# Patient Record
Sex: Female | Born: 1980 | Race: Black or African American | Hispanic: No | Marital: Single | Smoking: Never smoker
Health system: Southern US, Community
[De-identification: ages and names within clinical notes are randomized; demographics above are authoritative.]

## PROBLEM LIST (undated history)

## (undated) DIAGNOSIS — E119 Type 2 diabetes mellitus without complications: Secondary | ICD-10-CM

---
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
## 1999-08-22 ENCOUNTER — Other Ambulatory Visit: Admission: RE | Admit: 1999-08-22 | Discharge: 1999-08-22 | Payer: Self-pay | Admitting: Obstetrics and Gynecology

## 1999-08-31 ENCOUNTER — Ambulatory Visit (HOSPITAL_COMMUNITY): Admission: RE | Admit: 1999-08-31 | Discharge: 1999-08-31 | Payer: Self-pay | Admitting: Obstetrics and Gynecology

## 1999-09-04 ENCOUNTER — Other Ambulatory Visit: Admission: RE | Admit: 1999-09-04 | Discharge: 1999-09-04 | Payer: Self-pay | Admitting: Obstetrics and Gynecology

## 2001-06-17 ENCOUNTER — Emergency Department (HOSPITAL_COMMUNITY): Admission: EM | Admit: 2001-06-17 | Discharge: 2001-06-17 | Payer: Self-pay | Admitting: Emergency Medicine

## 2004-01-06 ENCOUNTER — Other Ambulatory Visit: Admission: RE | Admit: 2004-01-06 | Discharge: 2004-01-06 | Payer: Self-pay | Admitting: Obstetrics and Gynecology

## 2004-07-27 ENCOUNTER — Emergency Department (HOSPITAL_COMMUNITY): Admission: EM | Admit: 2004-07-27 | Discharge: 2004-07-27 | Payer: Self-pay | Admitting: *Deleted

## 2004-09-12 ENCOUNTER — Other Ambulatory Visit: Admission: RE | Admit: 2004-09-12 | Discharge: 2004-09-12 | Payer: Self-pay | Admitting: Obstetrics and Gynecology

## 2004-10-04 ENCOUNTER — Other Ambulatory Visit: Admission: RE | Admit: 2004-10-04 | Discharge: 2004-10-04 | Payer: Self-pay | Admitting: Obstetrics and Gynecology

## 2014-08-23 ENCOUNTER — Ambulatory Visit (INDEPENDENT_AMBULATORY_CARE_PROVIDER_SITE_OTHER): Payer: Managed Care, Other (non HMO) | Admitting: Family Medicine

## 2014-08-23 VITALS — BP 108/70 | HR 127 | Temp 98.8°F | Resp 16 | Ht 68.0 in | Wt 213.0 lb

## 2014-08-23 DIAGNOSIS — J029 Acute pharyngitis, unspecified: Secondary | ICD-10-CM | POA: Diagnosis not present

## 2014-08-23 MED ORDER — AMOXICILLIN 875 MG PO TABS: 875.0000 mg | ORAL_TABLET | Freq: Two times a day (BID) | ORAL | Status: DC

## 2014-08-23 NOTE — Patient Instructions (Signed)
We are running a throat culture. This should be ready on Wednesday. I'll you start taking the antibiotics twice a day instead of work until Wednesday. You can take ibuprofen to control the muscle aches and possibly fever

## 2014-08-23 NOTE — Addendum Note (Signed)
Addended by: Johnnette LitterARDWELL, Vaughan Garfinkle M on: 08/23/2014 10:40 AM   Modules accepted: Kipp BroodSmartSet

## 2014-08-23 NOTE — Progress Notes (Signed)
Patient ID: Haley Cunningham, female   DOB: April 24, 1981, 34 y.o.   MRN: [id number]  This chart was scribed for Elvina Sidle, MD by Charline Bills, ED Scribe. The patient was seen in room 12. Patient's care was started at 10:12 AM.  Patient ID: Haley Cunningham MRN: [id number], DOB: [birth year], 34 y.o. Date of Encounter: 08/23/2014, 10:12 AM  Primary Physician: No primary care provider on file.  Chief Complaint  Patient presents with   Sore Throat    Onset 5 days   Headache   Fatigue   Cough   HPI: 34 y.o. year old female with history below presents with gradually worsening, persistent sore throat for the past 5 days. She noticed sore throat 5 days ago but states that it worsened with swallowing 4 days ago. Pt reports associated chills, diaphoresis, cough, fatigue, HA. She denies fever. No known allergies.   Pt works at the [employer].   History reviewed. No pertinent past medical history.   Home Meds: Prior to Admission medications   Medication Sig Start Date End Date Taking? Authorizing Provider  ferrous fumarate (HEMOCYTE - 106 MG FE) 325 (106 FE) MG TABS tablet Take 1 tablet by mouth.   Yes Historical Provider, MD  phentermine (ADIPEX-P) 37.5 MG tablet Take 37.5 mg by mouth daily before breakfast.   Yes Historical Provider, MD  Vitamin D, Ergocalciferol, (DRISDOL) 50000 UNITS CAPS capsule Take 50,000 Units by mouth every 7 (seven) days.   Yes Historical Provider, MD    Allergies: Not on File  History   Social History   Marital Status: Single    Spouse Name: N/A   Number of Children: N/A   Years of Education: N/A   Occupational History   Not on file.   Social History Main Topics   Smoking status: Never Smoker    Smokeless tobacco: Not on file   Alcohol Use: No   Drug Use: No   Sexual Activity: Not on file   Other Topics Concern   Not on file   Social History Narrative   No narrative on file     Review of Systems: Constitutional:  negative for fever, night sweats, weight changes, + fatigue, + chills, + diaphoresis  HEENT: negative for vision changes, hearing loss, congestion, rhinorrhea, ST, epistaxis, or sinus pressure Cardiovascular: negative for chest pain or palpitations Respiratory: negative for hemoptysis, wheezing or shortness of breath, + cough Abdominal: negative for abdominal pain, nausea, vomiting, diarrhea, or constipation Dermatological: negative for rash Neurologic: negative for dizziness, or syncope, + headache All other systems reviewed and are otherwise negative with the exception to those above and in the HPI.  Physical Exam: Blood pressure 108/70, pulse 127, temperature 98.8 F (37.1 C), temperature source Oral, resp. rate 16, height  (1.727 m), weight 213 lb (96.616 kg), last menstrual period 08/20/2014, SpO2 96 %., Body mass index is 32.39 kg/(m^2). General: Well developed, well nourished, in no acute distress. Head: Normocephalic, atraumatic, eyes without discharge, sclera non-icteric, nares are without discharge. Bilateral auditory canals clear, bilateral TMs are dull. Oral cavity moist, posterior pharynx without exudate, peritonsillar abscess, or post nasal drip. Posterior erythema. Neck: Supple. No thyromegaly. Full ROM. No lymphadenopathy. Lungs: Clear bilaterally to auscultation without wheezes, rales, or rhonchi. Breathing is unlabored. Heart: RRR with S1 S2. No murmurs, rubs, or gallops appreciated. Abdomen: Soft, non-tender, non-distended with normoactive bowel sounds. No hepatomegaly. No rebound/guarding. No obvious abdominal masses. Msk:  Strength and tone normal for age. Extremities/Skin: Warm and dry.  No clubbing or cyanosis. No edema. No rashes or suspicious lesions. Neuro: Alert and oriented X 3. Moves all extremities spontaneously. Gait is normal. CNII-XII grossly in tact. Psych:  Responds to questions appropriately with a normal affect.    ASSESSMENT AND PLAN:  34 y.o. year  old female with  1. Acute pharyngitis, unspecified pharyngitis type     This chart was scribed in my presence and reviewed by me personally.    ICD-9-CM ICD-10-CM   1. Acute pharyngitis, unspecified pharyngitis type 462 J02.9 Culture, Group A Strep     amoxicillin (AMOXIL) 875 MG tablet     Signed, Elvina SidleKurt Lauenstein, MD   Signed, Elvina SidleKurt Lauenstein, MD 08/23/2014 10:12 AM

## 2014-08-25 LAB — CULTURE, GROUP A STREP: Organism ID, Bacteria: NORMAL

## 2015-09-12 ENCOUNTER — Ambulatory Visit (INDEPENDENT_AMBULATORY_CARE_PROVIDER_SITE_OTHER): Payer: Managed Care, Other (non HMO) | Admitting: Physician Assistant

## 2015-09-12 VITALS — BP 122/82 | HR 98 | Temp 99.0°F | Resp 16 | Ht 69.0 in | Wt 213.0 lb

## 2015-09-12 DIAGNOSIS — L02419 Cutaneous abscess of limb, unspecified: Secondary | ICD-10-CM

## 2015-09-12 DIAGNOSIS — L03119 Cellulitis of unspecified part of limb: Secondary | ICD-10-CM

## 2015-09-12 MED ORDER — DOXYCYCLINE HYCLATE 100 MG PO CAPS: 100.0000 mg | ORAL_CAPSULE | Freq: Two times a day (BID) | ORAL | Status: AC

## 2015-09-12 NOTE — Patient Instructions (Addendum)
Take doxy twice a day for 10 days. Apply warm compresses and take warm baths with epsom salt If redness spreads more than 1 cm beyond marking, if your pain gets much worse, or if you develop fever/chills -- return to clinic. Would likely need to have drained at that time.    IF you received an x-ray today, you will receive an invoice from [facility]. Please contact [facility] at [phone] with questions or concerns regarding your invoice.   IF you received labwork today, you will receive an invoice from [facility]. Please contact [facility] at [phone] with questions or concerns regarding your invoice.   Our billing staff will not be able to assist you with questions regarding bills from these companies.  You will be contacted with the lab results as soon as they are available. The fastest way to get your results is to activate your My Chart account. Instructions are located on the last page of this paperwork. If you have not heard from us regarding the results in 2 weeks, please contact this office.

## 2015-09-12 NOTE — Progress Notes (Signed)
[facility] [address], [place] [id number] [phone]  Date:  09/12/2015   Name:  Haley Cunningham   DOB:  February 11, 1981   MRN:  [id number]  PCP:  No PCP Per Patient    Chief Complaint: Leg Pain and Leg Swelling   History of Present Illness:  This is a 35 y.o. female with PMH obesity, anemia, vit D def who is presenting with right leg pain and swelling x 3 days. Right outer thigh with redness and swelling. She states "i think it's a boil". States about 2 weeks ago she had a similar lesion on her left thigh and then another lesion on her right inner thigh. She applied heat and these went away. She has been applying heat to this lesion and doesn't seem to be helping. She denies fever, chills. She has never had these before now. Her daughter had a boil that that to incised a couple months ago.  No abx allergies. She is not sexually active currently.  Review of Systems:  Review of Systems See HPI  There are no active problems to display for this patient.   Prior to Admission medications   Medication Sig Start Date End Date Taking? Authorizing Provider  ferrous fumarate (HEMOCYTE - 106 MG FE) 325 (106 FE) MG TABS tablet Take 1 tablet by mouth.   Yes Historical Provider, MD  phentermine (ADIPEX-P) 37.5 MG tablet Take 37.5 mg by mouth daily before breakfast.   Yes Historical Provider, MD  Vitamin D, Ergocalciferol, (DRISDOL) 50000 UNITS CAPS capsule Take 50,000 Units by mouth every 7 (seven) days.   Yes Historical Provider, MD    No Known Allergies  History reviewed. No pertinent past surgical history.  Social History  Substance Use Topics  . Smoking status: Never Smoker   . Smokeless tobacco: None  . Alcohol Use: No    Family History  Problem Relation Age of Onset  . Diabetes Maternal Grandmother   . Diabetes Maternal Grandfather     Medication list has been reviewed and updated.  Physical Examination:  Physical Exam  Constitutional: She is  oriented to person, place, and time. She appears well-developed and well-nourished. No distress.  HENT:  Head: Normocephalic and atraumatic.  Right Ear: Hearing normal.  Left Ear: Hearing normal.  Nose: Nose normal.  Eyes: Conjunctivae and lids are normal. Right eye exhibits no discharge. Left eye exhibits no discharge. No scleral icterus.  Pulmonary/Chest: Effort normal. No respiratory distress.  Musculoskeletal: Normal range of motion.  Neurological: She is alert and oriented to person, place, and time.  Skin: Skin is warm, dry and intact.  Right upper outer thigh with 3-4 inch area of erythema and swelling. There is central 1-2 cm induration however no fluctuance. TTP. + warmth. Erythema demarcated.  Psychiatric: She has a normal mood and affect. Her speech is normal and behavior is normal. Thought content normal.   BP 122/82 mmHg  Pulse 98  Temp(Src) 99 F (37.2 C) (Oral)  Resp 16  Ht 5\' 9"  (1.753 m)  Wt 213 lb (96.616 kg)  BMI 31.44 kg/m2  SpO2 99%  LMP 09/06/2015  Assessment and Plan:  1. Cellulitis and abscess of leg Developing abscess with cellulitis. Only induration currently, no fluctuance. Prescribed doxy. She will continue with warm compressed. If erythema spreads >1 cm beyond demarcation, develops worsening pain or develops fever/malaise -- RTC for further eval and possible I&D. - doxycycline (VIBRAMYCIN) 100 MG capsule; Take 1 capsule (100 mg total)  by mouth 2 (two) times daily. AVOID EXCESS SUN EXPOSURE WHILE ON THIS MEDICATION  Dispense: 20 capsule; Refill: 0   [place] [employer]. Dyke Brackett, MHS [facility]  09/12/2015

## 2016-06-04 ENCOUNTER — Ambulatory Visit (HOSPITAL_COMMUNITY)
Admission: EM | Admit: 2016-06-04 | Discharge: 2016-06-04 | Disposition: A | Discharge status: Home / Self Care | Payer: Managed Care, Other (non HMO) | Attending: Internal Medicine | Admitting: Internal Medicine

## 2016-06-04 DIAGNOSIS — J069 Acute upper respiratory infection, unspecified: Secondary | ICD-10-CM | POA: Diagnosis not present

## 2016-06-04 DIAGNOSIS — R69 Illness, unspecified: Secondary | ICD-10-CM | POA: Diagnosis not present

## 2016-06-04 DIAGNOSIS — J111 Influenza due to unidentified influenza virus with other respiratory manifestations: Secondary | ICD-10-CM

## 2016-06-04 NOTE — Discharge Instructions (Signed)
This likely you have a viral syndrome with flulike symptoms. Be sure to drink plenty fluids and stay well-hydrated. This will take about 4-6 days to run its course. He will probably feel better a little before the end of this time. The following medications can help with your symptoms. Sudafed PE 10 mg every 4 to 6 hours as needed for congestion Allegra or Zyrtec daily as needed for drainage and runny nose. For stronger antihistamine may take Chlor-Trimeton 2 to 4 mg every 4 to 6 hours, may cause drowsiness. Saline nasal spray used frequently. Ibuprofen 600 mg every 6 hours as needed for pain, discomfort or fever. Drink plenty of fluids and stay well-hydrated.

## 2016-06-04 NOTE — ED Provider Notes (Signed)
CSN: [id number]     Arrival date & time 06/04/16  1000 History   First MD Initiated Contact with Patient 06/04/16 1014     Chief Complaint  Patient presents with  . URI   (Consider location/radiation/quality/duration/timing/severity/associated sxs/prior Treatment) 36 year old female with a 2 day history of body aches, headache, sore throat sweating, feeling hot but no documented fevers. Denies earache or GI symptoms. She has taken Alka-Seltzer cold plus and Mucinex. She has taken no medications for aches pains and headaches. Current temperature is 98.8.      No past medical history on file. No past surgical history on file. Family History  Problem Relation Age of Onset  . Diabetes Maternal Grandmother   . Diabetes Maternal Grandfather    Social History  Substance Use Topics  . Smoking status: Never Smoker  . Smokeless tobacco: Not on file  . Alcohol use No   OB History    No data available     Review of Systems  Constitutional: Negative for activity change, appetite change, chills, fatigue and fever.  HENT: Positive for congestion, postnasal drip, rhinorrhea and sore throat. Negative for facial swelling.   Eyes: Negative.   Respiratory: Negative.   Cardiovascular: Negative.   Gastrointestinal: Negative.   Musculoskeletal: Negative for neck pain and neck stiffness.  Skin: Negative for pallor and rash.  Neurological: Negative.   All other systems reviewed and are negative.   Allergies  Patient has no known allergies.  Home Medications   Prior to Admission medications   Medication Sig Start Date End Date Taking? Authorizing Provider  ferrous fumarate (HEMOCYTE - 106 MG FE) 325 (106 FE) MG TABS tablet Take 1 tablet by mouth.    Historical Provider, MD  phentermine (ADIPEX-P) 37.5 MG tablet Take 37.5 mg by mouth daily before breakfast.    Historical Provider, MD  Vitamin D, Ergocalciferol, (DRISDOL) 50000 UNITS CAPS capsule Take 50,000 Units by mouth every 7 (seven)  days.    Historical Provider, MD   Meds Ordered and Administered this Visit  Medications - No data to display  BP 109/77 (BP Location: Left Arm)   Pulse 103   Temp 98.8 F (37.1 C) (Oral)   Resp 16   LMP 05/16/2016   SpO2 100%  No data found.   Physical Exam  Constitutional: She is oriented to person, place, and time. She appears well-developed and well-nourished. No distress.  No apparent shortness of breath, coughing or sneezing during the exam.  HENT:  Right Ear: External ear normal.  Left Ear: External ear normal.  Mouth/Throat: No oropharyngeal exudate.  Bilateral TMs are normal. Oropharynx with minor erythema, cobblestoning and clear PND.  Eyes: EOM are normal. Pupils are equal, round, and reactive to light.  Neck: Normal range of motion. Neck supple.  Cardiovascular: Normal rate, regular rhythm and normal heart sounds.   Pulmonary/Chest: Effort normal and breath sounds normal. No respiratory distress. She has no wheezes. She has no rales.  Musculoskeletal: Normal range of motion. She exhibits no edema.  Lymphadenopathy:    She has no cervical adenopathy.  Neurological: She is alert and oriented to person, place, and time.  Skin: Skin is warm and dry.  Psychiatric: She has a normal mood and affect.  Nursing note and vitals reviewed.   Urgent Care Course     Procedures (including critical care time)  Labs Review Labs Reviewed - No data to display  Imaging Review No results found.   Visual Acuity Review  Right Eye Distance:  Left Eye Distance:   Bilateral Distance:    Right Eye Near:   Left Eye Near:    Bilateral Near:         MDM   1. Acute upper respiratory infection   2. Influenza-like illness    Sudafed PE 10 mg every 4 to 6 hours as needed for congestion Allegra or Zyrtec daily as needed for drainage and runny nose. For stronger antihistamine may take Chlor-Trimeton 2 to 4 mg every 4 to 6 hours, may cause drowsiness. Saline nasal spray  used frequently. Ibuprofen 600 mg every 6 hours as needed for pain, discomfort or fever. Drink plenty of fluids and stay well-hydrated.     Hayden Rasmussenavid Anacarolina Evelyn, NP 06/04/16 1036

## 2016-06-04 NOTE — ED Triage Notes (Signed)
States she feels as she has the flu Cold sweat chills, body ache, sore throat otc meds taking as tx

## 2018-01-03 ENCOUNTER — Encounter (HOSPITAL_COMMUNITY): Payer: Self-pay | Admitting: Emergency Medicine

## 2018-01-03 ENCOUNTER — Ambulatory Visit (HOSPITAL_COMMUNITY)
Admission: EM | Admit: 2018-01-03 | Discharge: 2018-01-03 | Disposition: A | Discharge status: Home / Self Care | Payer: 59 | Attending: Family Medicine | Admitting: Family Medicine

## 2018-01-03 DIAGNOSIS — S39012A Strain of muscle, fascia and tendon of lower back, initial encounter: Secondary | ICD-10-CM | POA: Diagnosis not present

## 2018-01-03 MED ORDER — CYCLOBENZAPRINE HCL 10 MG PO TABS: 10.0000 mg | ORAL_TABLET | Freq: Two times a day (BID) | ORAL | 0 refills | Status: DC | PRN

## 2018-01-03 MED ORDER — KETOROLAC TROMETHAMINE 60 MG/2ML IM SOLN
60.0000 mg | Freq: Once | INTRAMUSCULAR | Status: AC
  Administered 2018-01-03: 60 mg via INTRAMUSCULAR

## 2018-01-03 MED ORDER — NAPROXEN 500 MG PO TABS: 500.0000 mg | ORAL_TABLET | Freq: Two times a day (BID) | ORAL | 0 refills | Status: DC

## 2018-01-03 MED ORDER — KETOROLAC TROMETHAMINE 60 MG/2ML IM SOLN
INTRAMUSCULAR | Status: AC
  Filled 2018-01-03: qty 2

## 2018-01-03 NOTE — ED Triage Notes (Signed)
Pt sts lower back pain x 4 days

## 2018-01-03 NOTE — Discharge Instructions (Signed)
It was nice meeting you!!  I believe that you have strained a muscle in your back.  Toradol injection here in clinic for pain and inflammation. We will send you home with some muscle relaxant and anti-inflammatory pain medication. Be aware the muscle relaxant can make you drowsy some people prefer to take this at bedtime. Follow up as needed for continued or worsening symptoms

## 2018-01-04 ENCOUNTER — Encounter (HOSPITAL_COMMUNITY): Payer: Self-pay | Admitting: Family Medicine

## 2018-01-04 NOTE — ED Provider Notes (Signed)
[facility]    CSN: [id number] Arrival date & time: 01/03/18  1104     History   Chief Complaint Chief Complaint  Patient presents with  . Back Pain    HPI Haley Cunningham is a 37 y.o. female.    Back Pain  Location:  Lumbar spine Quality:  Aching Radiates to:  Does not radiate Pain severity:  Moderate Pain is:  Same all the time Onset quality:  Gradual Timing:  Constant Progression:  Waxing and waning Chronicity:  New Context: lifting heavy objects   Relieved by:  Nothing Worsened by:  Movement, twisting and bending Ineffective treatments:  None tried Associated symptoms: no abdominal pain, no abdominal swelling, no bladder incontinence, no bowel incontinence, no chest pain, no dysuria, no fever, no headaches, no leg pain, no numbness, no paresthesias, no pelvic pain, no perianal numbness, no tingling, no weakness and no weight loss   Risk factors: no hx of cancer, no hx of osteoporosis, no lack of exercise, no menopause, not obese, not pregnant, no recent surgery, no steroid use and no vascular disease     History reviewed. No pertinent past medical history.  There are no active problems to display for this patient.   History reviewed. No pertinent surgical history.  OB History   None      Home Medications    Prior to Admission medications   Medication Sig Start Date End Date Taking? Authorizing Provider  cyclobenzaprine (FLEXERIL) 10 MG tablet Take 1 tablet (10 mg total) by mouth 2 (two) times daily as needed for muscle spasms. 01/03/18   Dahlia Byes A, NP  ferrous fumarate (HEMOCYTE - 106 MG FE) 325 (106 FE) MG TABS tablet Take 1 tablet by mouth.    [provider]  naproxen (NAPROSYN) 500 MG tablet Take 1 tablet (500 mg total) by mouth 2 (two) times daily. 01/03/18   Dahlia Byes A, NP  phentermine (ADIPEX-P) 37.5 MG tablet Take 37.5 mg by mouth daily before breakfast.    [provider]  Vitamin D, Ergocalciferol, (DRISDOL)  50000 UNITS CAPS capsule Take 50,000 Units by mouth every 7 (seven) days.    [provider]    Family History Family History  Problem Relation Age of Onset  . Diabetes Maternal Grandmother   . Diabetes Maternal Grandfather     Social History Social History   Tobacco Use  . Smoking status: Never Smoker  . Smokeless tobacco: Never Used  Substance Use Topics  . Alcohol use: No    Alcohol/week: 0.0 standard drinks  . Drug use: No     Allergies   Patient has no known allergies.   Review of Systems Review of Systems  Constitutional: Negative for fever and weight loss.  Cardiovascular: Negative for chest pain.  Gastrointestinal: Negative for abdominal pain and bowel incontinence.  Genitourinary: Negative for bladder incontinence, dysuria and pelvic pain.  Musculoskeletal: Positive for back pain.  Neurological: Negative for tingling, weakness, numbness, headaches and paresthesias.     Physical Exam Triage Vital Signs ED Triage Vitals  Enc Vitals Group     BP 01/03/18 1128 122/65     Pulse Rate 01/03/18 1128 86     Resp 01/03/18 1128 18     Temp 01/03/18 1128 98.1 F (36.7 C)     Temp Source 01/03/18 1128 Oral     SpO2 01/03/18 1128 100 %     Weight --      Height --  Head Circumference --      Peak Flow --      Pain Score 01/03/18 1207 8     Pain Loc --      Pain Edu? --      Excl. in GC? --    No data found.  Updated Vital Signs BP 122/65 (BP Location: Right Arm)   Pulse 86   Temp 98.1 F (36.7 C) (Oral)   Resp 18   SpO2 100%   Visual Acuity Right Eye Distance:   Left Eye Distance:   Bilateral Distance:    Right Eye Near:   Left Eye Near:    Bilateral Near:     Physical Exam  Constitutional: She is oriented to person, place, and time. She appears well-developed and well-nourished.  Very pleasant. Non toxic or ill appearing.     HENT:  Head: Normocephalic and atraumatic.  Nose: Nose normal.  Eyes: Conjunctivae are normal.    Neck: Normal range of motion.  Cardiovascular: Normal rate and regular rhythm.  Pulmonary/Chest: Effort normal and breath sounds normal.  Musculoskeletal:  Mildly tender to lumbar paravertebral muscles.  Negative straight leg raise.  Able to ambulate in the room.   Neurological: She is alert and oriented to person, place, and time.  Skin: Skin is warm and dry.  Psychiatric: She has a normal mood and affect.  Nursing note and vitals reviewed.    UC Treatments / Results  Labs (all labs ordered are listed, but only abnormal results are displayed) Labs Reviewed - No data to display  EKG None  Radiology No results found.  Procedures Procedures (including critical care time)  Medications Ordered in UC Medications  ketorolac (TORADOL) injection 60 mg (60 mg Intramuscular Given 01/03/18 1203)    Initial Impression / Assessment and Plan / UC Course  I have reviewed the triage vital signs and the nursing notes.  Pertinent labs & imaging results that were available during my care of the patient were reviewed by me and considered in my medical decision making (see chart for details).     Toradol injection in clinic. Muscle relaxant and anti-inflammatory pain medication outpatient. Follow up as needed for continued or worsening symptoms  Final Clinical Impressions(s) / UC Diagnoses   Final diagnoses:  Strain of lumbar region, initial encounter     Discharge Instructions     It was nice meeting you!!  I believe that you have strained a muscle in your back.  Toradol injection here in clinic for pain and inflammation. We will send you home with some muscle relaxant and anti-inflammatory pain medication. Be aware the muscle relaxant can make you drowsy some people prefer to take this at bedtime. Follow up as needed for continued or worsening symptoms     ED Prescriptions    Medication Sig Dispense Auth. Provider   naproxen (NAPROSYN) 500 MG tablet Take 1 tablet (500 mg  total) by mouth 2 (two) times daily. 30 tablet Darrielle Pflieger A, NP   cyclobenzaprine (FLEXERIL) 10 MG tablet Take 1 tablet (10 mg total) by mouth 2 (two) times daily as needed for muscle spasms. 20 tablet Dahlia Byes A, NP     Controlled Substance Prescriptions [place] Controlled Substance Registry consulted? Not Applicable   Janace Aris, NP 01/04/18 1024

## 2018-09-05 ENCOUNTER — Other Ambulatory Visit: Payer: Self-pay

## 2018-09-05 ENCOUNTER — Ambulatory Visit (HOSPITAL_COMMUNITY)
Admission: EM | Admit: 2018-09-05 | Discharge: 2018-09-05 | Disposition: A | Discharge status: Home / Self Care | Payer: 59 | Attending: Internal Medicine | Admitting: Internal Medicine

## 2018-09-05 DIAGNOSIS — K219 Gastro-esophageal reflux disease without esophagitis: Secondary | ICD-10-CM

## 2018-09-05 DIAGNOSIS — R51 Headache: Secondary | ICD-10-CM

## 2018-09-05 DIAGNOSIS — R519 Headache, unspecified: Secondary | ICD-10-CM

## 2018-09-05 MED ORDER — ALUM & MAG HYDROXIDE-SIMETH 200-200-20 MG/5ML PO SUSP
ORAL | Status: AC
  Filled 2018-09-05: qty 30

## 2018-09-05 MED ORDER — ALUM & MAG HYDROXIDE-SIMETH 200-200-20 MG/5ML PO SUSP
15.0000 mL | Freq: Once | ORAL | Status: AC
  Administered 2018-09-05: 15 mL via ORAL

## 2018-09-05 MED ORDER — PANTOPRAZOLE SODIUM 20 MG PO TBEC: 20.0000 mg | DELAYED_RELEASE_TABLET | Freq: Two times a day (BID) | ORAL | 0 refills | Status: DC

## 2018-09-05 NOTE — ED Triage Notes (Signed)
Pt is saying that after she eats she feels like it is coming up. With more of a scratchy feeling.  Pt says she feels like it is burning when she burps. Pt says when she lays down it feels like food is coming up and makes her feels sick and dizzy. No SOB,  No chest pain

## 2018-09-05 NOTE — ED Provider Notes (Signed)
[facility]    CSN: [id number] Arrival date & time: 09/05/18  1651     History   Chief Complaint No chief complaint on file.   HPI Haley Cunningham is a 38 y.o. female no past medical history comes to urgent care with a one-week history of burning chest pain in the central aspect of her chest.  Pain is intermittent and severe.  She currently has retrosternal burning chest pain.  Pain is worse after she eats especially spicy food or when she is in the recumbent position.  She has not tried any over-the-counter medications.  Symptoms are associated with a headache, dizziness, nausea without vomiting.  No diarrhea.  No history of constipation.  Patient denies any radiation of the pain to the neck, arms or back.  HPI  No past medical history on file.  There are no active problems to display for this patient.   No past surgical history on file.  OB History   No obstetric history on file.      Home Medications    Prior to Admission medications   Medication Sig Start Date End Date Taking? Authorizing Provider  cyclobenzaprine (FLEXERIL) 10 MG tablet Take 1 tablet (10 mg total) by mouth 2 (two) times daily as needed for muscle spasms. 01/03/18   Dahlia ByesBast, Traci A, NP  ferrous fumarate (HEMOCYTE - 106 MG FE) 325 (106 FE) MG TABS tablet Take 1 tablet by mouth.    [provider]  naproxen (NAPROSYN) 500 MG tablet Take 1 tablet (500 mg total) by mouth 2 (two) times daily. 01/03/18   Dahlia ByesBast, Traci A, NP  phentermine (ADIPEX-P) 37.5 MG tablet Take 37.5 mg by mouth daily before breakfast.    [provider]  Vitamin D, Ergocalciferol, (DRISDOL) 50000 UNITS CAPS capsule Take 50,000 Units by mouth every 7 (seven) days.    [provider]    Family History Family History  Problem Relation Age of Onset  . Diabetes Maternal Grandmother   . Diabetes Maternal Grandfather     Social History Social History   Tobacco Use  . Smoking status: Never Smoker  .  Smokeless tobacco: Never Used  Substance Use Topics  . Alcohol use: No    Alcohol/week: 0.0 standard drinks  . Drug use: No     Allergies   Patient has no known allergies.   Review of Systems Review of Systems  Constitutional: Positive for activity change and diaphoresis. Negative for appetite change, fatigue and fever.  Eyes: Negative.   Respiratory: Positive for choking. Negative for apnea, cough, chest tightness, shortness of breath, wheezing and stridor.   Cardiovascular: Negative for chest pain, palpitations and leg swelling.  Gastrointestinal: Positive for nausea. Negative for abdominal pain and rectal pain.  Genitourinary: Negative.   Musculoskeletal: Negative.   Skin: Negative.   Neurological: Positive for dizziness and headaches. Negative for syncope, weakness and numbness.  Hematological: Negative.   Psychiatric/Behavioral: Negative.   All other systems reviewed and are negative.    Physical Exam Triage Vital Signs ED Triage Vitals  Enc Vitals Group     BP --      Pulse Rate 09/05/18 1708 92     Resp 09/05/18 1708 18     Temp 09/05/18 1708 98.5 F (36.9 C)     Temp Source 09/05/18 1708 Oral     SpO2 09/05/18 1708 100 %     Weight --      Height --      Head  Circumference --      Peak Flow --      Pain Score 09/05/18 1707 0     Pain Loc --      Pain Edu? --      Excl. in [place]? --    No data found.  Updated Vital Signs Pulse 92   Temp 98.5 F (36.9 C) (Oral)   Resp 18   SpO2 100%   Visual Acuity Right Eye Distance:   Left Eye Distance:   Bilateral Distance:    Right Eye Near:   Left Eye Near:    Bilateral Near:     Physical Exam Vitals signs and nursing note reviewed.  Constitutional:      General: She is in acute distress.     Appearance: Normal appearance. She is obese. She is not ill-appearing.  Cardiovascular:     Rate and Rhythm: Normal rate and regular rhythm.     Pulses: Normal pulses.     Heart sounds: Normal heart sounds.   Pulmonary:     Effort: Pulmonary effort is normal.     Breath sounds: Normal breath sounds.  Abdominal:     General: Abdomen is flat. Bowel sounds are normal.     Palpations: Abdomen is soft.  Skin:    General: Skin is warm.     Capillary Refill: Capillary refill takes less than 2 seconds.  Neurological:     General: No focal deficit present.     Mental Status: She is alert and oriented to person, place, and time.  Psychiatric:        Mood and Affect: Mood normal.        Behavior: Behavior normal.      UC Treatments / Results  Labs (all labs ordered are listed, but only abnormal results are displayed) Labs Reviewed - No data to display  EKG None  Radiology No results found.  Procedures Procedures (including critical care time)  Medications Ordered in UC Medications - No data to display  Initial Impression / Assessment and Plan / UC Course  I have reviewed the triage vital signs and the nursing notes.  Pertinent labs & imaging results that were available during my care of the patient were reviewed by me and considered in my medical decision making (see chart for details).     1.  Gastroesophageal reflux disease: GERD precautions given- limit spicy food intake, caffeinated drinks, coffee, alcohol. Protonix 20 mg twice daily Maalox x1 dose If no improvement after a month of consistent PPI use, gastroenterology referral/evaluation is warranted  2.  Headache, blood pressure is normal: Tylenol as needed for headache. Final Clinical Impressions(s) / UC Diagnoses   Final diagnoses:  None   Discharge Instructions   None    ED Prescriptions    None     Controlled Substance Prescriptions [place] Controlled Substance Registry consulted? No   Merrilee Jansky, MD 09/05/18 [phone]

## 2018-12-22 ENCOUNTER — Ambulatory Visit (INDEPENDENT_AMBULATORY_CARE_PROVIDER_SITE_OTHER): Payer: Self-pay

## 2018-12-22 ENCOUNTER — Ambulatory Visit (HOSPITAL_COMMUNITY)
Admission: EM | Admit: 2018-12-22 | Discharge: 2018-12-22 | Disposition: A | Discharge status: Home / Self Care | Payer: Self-pay | Attending: Emergency Medicine | Admitting: Emergency Medicine

## 2018-12-22 ENCOUNTER — Other Ambulatory Visit: Payer: Self-pay

## 2018-12-22 DIAGNOSIS — M79672 Pain in left foot: Secondary | ICD-10-CM

## 2018-12-22 MED ORDER — IBUPROFEN 800 MG PO TABS: 800.0000 mg | ORAL_TABLET | Freq: Three times a day (TID) | ORAL | 0 refills | Status: DC | PRN

## 2018-12-22 NOTE — Discharge Instructions (Signed)
Your x-ray shows that you have a small bone spur on your heel.  Rest your feet for 2 to 3 days.  Make sure you are wearing shoes that have good heel padding when you walk.  Take the prescribed ibuprofen as needed for discomfort.    Follow-up with the orthopedic listed if your pain persists.

## 2018-12-22 NOTE — ED Triage Notes (Signed)
Pt states she has a sharp pain in her left heel. Pt states it hurts when she puts her weight on it. X 4 days.

## 2018-12-22 NOTE — ED Provider Notes (Signed)
[facility]    CSN: [id number] Arrival date & time: 12/22/18  [id number]      History   Chief Complaint Chief Complaint  Patient presents with  . Foot Pain    HPI Haley Cunningham is a 38 y.o. female.   Patient presents with pain in her left heel x 4 days.  The pain is worse with weightbearing and walking; improves with rest and elevation.  Patient states she started a walking program approximately 1 month ago.  No falls or injury.  She denies weakness, numbness, paresthesias, or other symptoms.  LMP: 2 weeks.     The history is provided by the patient.    No past medical history on file.  There are no active problems to display for this patient.   No past surgical history on file.  OB History   No obstetric history on file.      Home Medications    Prior to Admission medications   Medication Sig Start Date End Date Taking? Authorizing Provider  cyclobenzaprine (FLEXERIL) 10 MG tablet Take 1 tablet (10 mg total) by mouth 2 (two) times daily as needed for muscle spasms. 01/03/18   Dahlia ByesBast, Traci A, NP  ferrous fumarate (HEMOCYTE - 106 MG FE) 325 (106 FE) MG TABS tablet Take 1 tablet by mouth.    [provider]  ibuprofen (ADVIL) 800 MG tablet Take 1 tablet (800 mg total) by mouth every 8 (eight) hours as needed. 12/22/18   Mickie Bailate, Farris Blash H, NP  pantoprazole (PROTONIX) 20 MG tablet Take 1 tablet (20 mg total) by mouth 2 (two) times daily for 30 days. 09/05/18 10/05/18  Merrilee JanskyLamptey, Philip O, MD  phentermine (ADIPEX-P) 37.5 MG tablet Take 37.5 mg by mouth daily before breakfast.    [provider]  Vitamin D, Ergocalciferol, (DRISDOL) 50000 UNITS CAPS capsule Take 50,000 Units by mouth every 7 (seven) days.    [provider]    Family History Family History  Problem Relation Age of Onset  . Diabetes Maternal Grandmother   . Diabetes Maternal Grandfather     Social History Social History   Tobacco Use  . Smoking status: Never Smoker  .  Smokeless tobacco: Never Used  Substance Use Topics  . Alcohol use: No    Alcohol/week: 0.0 standard drinks  . Drug use: No     Allergies   Patient has no known allergies.   Review of Systems Review of Systems  Constitutional: Negative for chills and fever.  HENT: Negative for ear pain and sore throat.   Eyes: Negative for pain and visual disturbance.  Respiratory: Negative for cough and shortness of breath.   Cardiovascular: Negative for chest pain and palpitations.  Gastrointestinal: Negative for abdominal pain and vomiting.  Genitourinary: Negative for dysuria and hematuria.  Musculoskeletal: Negative for arthralgias and back pain.  Skin: Negative for color change, rash and wound.  Neurological: Negative for seizures, syncope, weakness and numbness.  All other systems reviewed and are negative.    Physical Exam Triage Vital Signs ED Triage Vitals  Enc Vitals Group     BP 12/22/18 1031 130/78     Pulse Rate 12/22/18 1031 85     Resp 12/22/18 1031 18     Temp 12/22/18 1031 98.5 F (36.9 C)     Temp Source 12/22/18 1031 Oral     SpO2 12/22/18 1031 100 %     Weight 12/22/18 1030 235 lb (106.6 kg)     Height --  Head Circumference --      Peak Flow --      Pain Score 12/22/18 1028 8     Pain Loc --      Pain Edu? --      Excl. in [place]? --    No data found.  Updated Vital Signs BP 130/78 (BP Location: Right Arm)   Pulse 85   Temp 98.5 F (36.9 C) (Oral)   Resp 18   Wt 235 lb (106.6 kg)   LMP 11/29/2018   SpO2 100%   BMI 34.70 kg/m   Visual Acuity Right Eye Distance:   Left Eye Distance:   Bilateral Distance:    Right Eye Near:   Left Eye Near:    Bilateral Near:     Physical Exam Vitals signs and nursing note reviewed.  Constitutional:      General: She is not in acute distress.    Appearance: She is well-developed.  HENT:     Head: Normocephalic and atraumatic.  Eyes:     Conjunctiva/sclera: Conjunctivae normal.  Neck:      Musculoskeletal: Neck supple.  Cardiovascular:     Rate and Rhythm: Normal rate and regular rhythm.     Heart sounds: No murmur.  Pulmonary:     Effort: Pulmonary effort is normal. No respiratory distress.     Breath sounds: Normal breath sounds.  Abdominal:     Palpations: Abdomen is soft.     Tenderness: There is no abdominal tenderness.  Musculoskeletal: Normal range of motion.        General: Tenderness present. No swelling or deformity.       Feet:     Comments: Left heel tender to palpation.  Skin:    General: Skin is warm and dry.     Capillary Refill: Capillary refill takes less than 2 seconds.     Findings: No bruising, erythema, lesion or rash.  Neurological:     General: No focal deficit present.     Mental Status: She is alert and oriented to person, place, and time.     Sensory: No sensory deficit.     Motor: No weakness.     Coordination: Coordination normal.     Gait: Gait normal.     Deep Tendon Reflexes: Reflexes normal.      UC Treatments / Results  Labs (all labs ordered are listed, but only abnormal results are displayed) Labs Reviewed - No data to display  EKG   Radiology Dg Foot Complete Left  Result Date: 12/22/2018 CLINICAL DATA:  Heel pain. EXAM: LEFT FOOT - COMPLETE 3+ VIEW COMPARISON:  None. FINDINGS: Mild hallux valgus deformity. Negative for a fracture or dislocation. Enthesopathic changes along the insertion of the Achilles tendon. Minimal spurring along the plantar aspect of the calcaneus. Prominent spurring or beaking along the dorsal aspect of the TMT joints. IMPRESSION: 1. No acute bone abnormality. 2. Mild hallux valgus deformity. 3. Minimal plantar calcaneal spurring. Prominent enthesopathic changes at the Achilles tendon insertion site. 4. Prominent spurring or beaking along the dorsal aspect of the TMT joint region. Electronically Signed   By: Markus Daft M.D.   On: 12/22/2018 11:28    Procedures Procedures (including critical care  time)  Medications Ordered in UC Medications - No data to display  Initial Impression / Assessment and Plan / UC Course  I have reviewed the triage vital signs and the nursing notes.  Pertinent labs & imaging results that were available during my care of  the patient were reviewed by me and considered in my medical decision making (see chart for details).   Left foot pain.  X-ray shows minimal plantar calcaneal spurring.  Instructed patient to rest from her walking routine for 2 to 3 days and then make sure when she restarts her walking that she is wearing shoes with good heel padding.  Discussed with patient that she could go to a foot store for proper fitting of walking shoes.  Instructed patient that she can take the prescribed ibuprofen as needed for her discomfort.  Discussed that she can follow-up with an orthopedic if her pain persists.     Final Clinical Impressions(s) / UC Diagnoses   Final diagnoses:  Foot pain, left     Discharge Instructions     Your x-ray shows that you have a small bone spur on your heel.  Rest your feet for 2 to 3 days.  Make sure you are wearing shoes that have good heel padding when you walk.  Take the prescribed ibuprofen as needed for discomfort.    Follow-up with the orthopedic listed if your pain persists.        ED Prescriptions    Medication Sig Dispense Auth. Provider   ibuprofen (ADVIL) 800 MG tablet Take 1 tablet (800 mg total) by mouth every 8 (eight) hours as needed. 21 tablet Mickie Bailate, Khrystal Jeanmarie H, NP     Controlled Substance Prescriptions [place] Controlled Substance Registry consulted? Not Applicable   Mickie Bailate, Meggen Spaziani H, NP 12/22/18 1148

## 2019-03-06 ENCOUNTER — Other Ambulatory Visit: Payer: Self-pay

## 2019-03-06 DIAGNOSIS — Z20822 Contact with and (suspected) exposure to covid-19: Secondary | ICD-10-CM

## 2019-03-07 LAB — NOVEL CORONAVIRUS, NAA: SARS-CoV-2, NAA: NOT DETECTED

## 2019-04-06 ENCOUNTER — Other Ambulatory Visit: Payer: Self-pay

## 2019-04-06 DIAGNOSIS — Z20822 Contact with and (suspected) exposure to covid-19: Secondary | ICD-10-CM

## 2019-04-07 LAB — NOVEL CORONAVIRUS, NAA: SARS-CoV-2, NAA: NOT DETECTED

## 2019-11-05 ENCOUNTER — Encounter (HOSPITAL_COMMUNITY): Payer: Self-pay

## 2019-11-05 ENCOUNTER — Other Ambulatory Visit: Payer: Self-pay

## 2019-11-05 ENCOUNTER — Ambulatory Visit (HOSPITAL_COMMUNITY)
Admission: EM | Admit: 2019-11-05 | Discharge: 2019-11-05 | Disposition: A | Discharge status: Home / Self Care | Payer: BC Managed Care – PPO | Attending: Family Medicine | Admitting: Family Medicine

## 2019-11-05 DIAGNOSIS — T148XXA Other injury of unspecified body region, initial encounter: Secondary | ICD-10-CM | POA: Diagnosis not present

## 2019-11-05 DIAGNOSIS — M62830 Muscle spasm of back: Secondary | ICD-10-CM

## 2019-11-05 MED ORDER — CYCLOBENZAPRINE HCL 10 MG PO TABS: 10.0000 mg | ORAL_TABLET | Freq: Two times a day (BID) | ORAL | 0 refills | Status: DC | PRN

## 2019-11-05 MED ORDER — IBUPROFEN 600 MG PO TABS: 600.0000 mg | ORAL_TABLET | Freq: Three times a day (TID) | ORAL | 0 refills | Status: DC | PRN

## 2019-11-05 NOTE — Discharge Instructions (Addendum)
Take the ibuprofen every 8 hours for pain, inflammation. He can use the muscle relaxer as needed.  Recommend heat to the area, gentle stretching. Follow up as needed for continued or worsening symptoms

## 2019-11-05 NOTE — ED Triage Notes (Signed)
Pt reports having upper pain x2 days. No known injury to back. No other symptoms at this time.

## 2019-11-06 NOTE — ED Provider Notes (Signed)
[facility]    CSN: [id number] Arrival date & time: 11/05/19  1139      History   Chief Complaint Chief Complaint  Patient presents with  . Back Pain    HPI Haley Cunningham is a 39 y.o. female.   Patient is a 39 year old female presents today with upper back pain.  This is been constant for 2 days.  No new injuries to the back or heavy lifting.  The pain is localized and there is no radiation of pain, numbness or tingling.  No weakness in upper extremities.  History of muscle spasms in the past. She has not taken anything for the pain.   ROS per HPI      History reviewed. No pertinent past medical history.  There are no problems to display for this patient.   History reviewed. No pertinent surgical history.  OB History   No obstetric history on file.      Home Medications    Prior to Admission medications   Medication Sig Start Date End Date Taking? Authorizing Provider  cyclobenzaprine (FLEXERIL) 10 MG tablet Take 1 tablet (10 mg total) by mouth 2 (two) times daily as needed for muscle spasms. 11/05/19   Dahlia Byes A, NP  ibuprofen (ADVIL) 600 MG tablet Take 1 tablet (600 mg total) by mouth every 8 (eight) hours as needed for moderate pain. 11/05/19   Dahlia Byes A, NP  phentermine (ADIPEX-P) 37.5 MG tablet Take 37.5 mg by mouth daily before breakfast.    [provider]  Vitamin D, Ergocalciferol, (DRISDOL) 50000 UNITS CAPS capsule Take 50,000 Units by mouth every 7 (seven) days.    [provider]  ferrous fumarate (HEMOCYTE - 106 MG FE) 325 (106 FE) MG TABS tablet Take 1 tablet by mouth.  11/05/19  [provider]  pantoprazole (PROTONIX) 20 MG tablet Take 1 tablet (20 mg total) by mouth 2 (two) times daily for 30 days. 09/05/18 11/05/19  Merrilee Jansky, MD    Family History Family History  Problem Relation Age of Onset  . Diabetes Maternal Grandmother   . Diabetes Maternal Grandfather     Social History Social History     Tobacco Use  . Smoking status: Never Smoker  . Smokeless tobacco: Never Used  Substance Use Topics  . Alcohol use: No    Alcohol/week: 0.0 standard drinks  . Drug use: No     Allergies   Patient has no known allergies.   Review of Systems Review of Systems   Physical Exam Triage Vital Signs ED Triage Vitals [11/05/19 1249]  Enc Vitals Group     BP (!) 121/57     Pulse Rate 70     Resp 16     Temp 98.4 F (36.9 C)     Temp Source Oral     SpO2 100 %     Weight 223 lb (101.2 kg)     Height 5\' 7"  (1.702 m)     Head Circumference      Peak Flow      Pain Score 8     Pain Loc      Pain Edu?      Excl. in [place]?    No data found.  Updated Vital Signs BP (!) 121/57   Pulse 70   Temp 98.4 F (36.9 C) (Oral)   Resp 16   Ht 5\' 7"  (1.702 m)   Wt 223 lb (101.2 kg)   LMP 10/18/2019 (Approximate)  SpO2 100%   BMI 34.93 kg/m   Visual Acuity Right Eye Distance:   Left Eye Distance:   Bilateral Distance:    Right Eye Near:   Left Eye Near:    Bilateral Near:     Physical Exam Vitals and nursing note reviewed.  Constitutional:      General: She is not in acute distress.    Appearance: Normal appearance. She is not ill-appearing, toxic-appearing or diaphoretic.  HENT:     Head: Normocephalic.     Nose: Nose normal.  Eyes:     Conjunctiva/sclera: Conjunctivae normal.  Pulmonary:     Effort: Pulmonary effort is normal.  Musculoskeletal:        General: Normal range of motion.     Cervical back: Normal range of motion.       Back:     Comments: TTP with swelling/muscle spasm  Skin:    General: Skin is warm and dry.     Findings: No rash.  Neurological:     Mental Status: She is alert.  Psychiatric:        Mood and Affect: Mood normal.      UC Treatments / Results  Labs (all labs ordered are listed, but only abnormal results are displayed) Labs Reviewed - No data to display  EKG   Radiology No results found.  Procedures Procedures  (including critical care time)  Medications Ordered in UC Medications - No data to display  Initial Impression / Assessment and Plan / UC Course  I have reviewed the triage vital signs and the nursing notes.  Pertinent labs & imaging results that were available during my care of the patient were reviewed by me and considered in my medical decision making (see chart for details).     Muscle strain and spasm Treating with muscle x-ray as needed.  Ibuprofen for pain as needed. Recommended heat to the area, gentle stretching. Follow up as needed for continued or worsening symptoms  Final Clinical Impressions(s) / UC Diagnoses   Final diagnoses:  Muscle strain  Muscle spasm of back     Discharge Instructions     Take the ibuprofen every 8 hours for pain, inflammation. He can use the muscle relaxer as needed.  Recommend heat to the area, gentle stretching. Follow up as needed for continued or worsening symptoms     ED Prescriptions    Medication Sig Dispense Auth. Provider   cyclobenzaprine (FLEXERIL) 10 MG tablet Take 1 tablet (10 mg total) by mouth 2 (two) times daily as needed for muscle spasms. 20 tablet Brandyn Thien A, NP   ibuprofen (ADVIL) 600 MG tablet Take 1 tablet (600 mg total) by mouth every 8 (eight) hours as needed for moderate pain. 30 tablet Dahlia Byes A, NP     PDMP not reviewed this encounter.   Dahlia Byes A, NP 11/06/19 1321

## 2020-01-11 ENCOUNTER — Emergency Department (HOSPITAL_COMMUNITY)
Admission: EM | Admit: 2020-01-11 | Discharge: 2020-01-11 | Discharge status: Against Medical Advice | Payer: BC Managed Care – PPO

## 2020-01-11 ENCOUNTER — Other Ambulatory Visit: Payer: Self-pay

## 2020-01-11 NOTE — ED Notes (Signed)
Pt returned labels to registration and eloped prior to triage.

## 2020-02-01 ENCOUNTER — Ambulatory Visit
Admission: EM | Admit: 2020-02-01 | Discharge: 2020-02-01 | Disposition: A | Discharge status: Home / Self Care | Payer: BC Managed Care – PPO | Attending: Emergency Medicine | Admitting: Emergency Medicine

## 2020-02-01 DIAGNOSIS — M778 Other enthesopathies, not elsewhere classified: Secondary | ICD-10-CM

## 2020-02-01 MED ORDER — NAPROXEN 500 MG PO TABS: 500.0000 mg | ORAL_TABLET | Freq: Two times a day (BID) | ORAL | 0 refills | Status: DC

## 2020-02-01 NOTE — Discharge Instructions (Addendum)
Heat therapy (hot compress, warm wash rag, hot showers, etc.) can help relax muscles and soothe muscle aches. Cold therapy (ice packs) can be used to help swelling both after injury and after prolonged use of areas of chronic pain/aches.  Pain medication:  500 mg Naprosyn/Aleve (naproxen) every 12 hours with food:  AVOID other NSAIDs while taking this (may have Tylenol).  Important to follow up with specialist(s) below for further evaluation/management if your symptoms persist or worsen. 

## 2020-02-01 NOTE — ED Provider Notes (Signed)
[name]    CSN: [id number] Arrival date & time: 02/01/20  1118      History   Chief Complaint Chief Complaint  Patient presents with  . Hand Pain    HPI Haley Cunningham is a 39 y.o. female  Presenting for chronic, intermittent right third MCP pain and swelling.  States it is worse when she uses her hand a lot or lifts heavy things.  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  Is right-hand dominant.  Denies weakness, tingling, injury.   History reviewed. No pertinent past medical history.  There are no problems to display for this patient.   History reviewed. No pertinent surgical history.  OB History   No obstetric history on file.      Home Medications    Prior to Admission medications   Medication Sig Start Date End Date Taking? Authorizing Provider  naproxen (NAPROSYN) 500 MG tablet Take 1 tablet (500 mg total) by mouth 2 (two) times daily. 02/01/20   Hall-Potvin, [place], PA-C  ferrous fumarate (HEMOCYTE - 106 MG FE) 325 (106 FE) MG TABS tablet Take 1 tablet by mouth.  11/05/19  [provider]  pantoprazole (PROTONIX) 20 MG tablet Take 1 tablet (20 mg total) by mouth 2 (two) times daily for 30 days. 09/05/18 11/05/19  Merrilee Jansky, MD  phentermine (ADIPEX-P) 37.5 MG tablet Take 37.5 mg by mouth daily before breakfast.  02/01/20  [provider]    Family History Family History  Problem Relation Age of Onset  . Healthy Mother   . Healthy Father   . Diabetes Maternal Grandmother   . Diabetes Maternal Grandfather     Social History Social History   Tobacco Use  . Smoking status: Never Smoker  . Smokeless tobacco: Never Used  Vaping Use  . Vaping Use: Never used  Substance Use Topics  . Alcohol use: No    Alcohol/week: 0.0 standard drinks  . Drug use: No     Allergies   Patient has no known allergies.   Review of Systems As per  HPI   Physical Exam Triage Vital Signs ED Triage Vitals  Enc Vitals Group     BP      Pulse      Resp      Temp      Temp src      SpO2      Weight      Height      Head Circumference      Peak Flow      Pain Score      Pain Loc      Pain Edu?      Excl. in GC?    No data found.  Updated Vital Signs BP 122/76 (BP Location: Right Arm)   Pulse 95   Temp 98.3 F (36.8 C) (Oral)   Resp 18   LMP 01/29/2020   SpO2 97%   Visual Acuity Right Eye Distance:   Left Eye Distance:   Bilateral Distance:    Right Eye Near:   Left Eye Near:    Bilateral Near:     Physical Exam Constitutional:      General: She is not in acute distress. HENT:     Head: Normocephalic and atraumatic.  Eyes:     General: No scleral icterus.    Pupils: Pupils are equal, round, and reactive to  light.  Cardiovascular:     Rate and Rhythm: Normal rate.  Pulmonary:     Effort: Pulmonary effort is normal.  Musculoskeletal:        General: Tenderness present. No swelling. Normal range of motion.     Comments: R 3rd MCP.  No crepitus.  NVI  Skin:    Coloration: Skin is not jaundiced or pale.  Neurological:     Mental Status: She is alert and oriented to person, place, and time.      UC Treatments / Results  Labs (all labs ordered are listed, but only abnormal results are displayed) Labs Reviewed - No data to display  EKG   Radiology No results found.  Procedures Procedures (including critical care time)  Medications Ordered in UC Medications - No data to display  Initial Impression / Assessment and Plan / UC Course  I have reviewed the triage vital signs and the nursing notes.  Pertinent labs & imaging results that were available during my care of the patient were reviewed by me and considered in my medical decision making (see chart for details).     Likely tendinitis, less concern for trigger finger at this time.  Will treat supportively as outlined below.  Provided  contact information for orthopedics as needed.  Return precautions discussed, pt verbalized understanding and is agreeable to plan. Final Clinical Impressions(s) / UC Diagnoses   Final diagnoses:  Right hand tendonitis     Discharge Instructions     Heat therapy (hot compress, warm wash rag, hot showers, etc.) can help relax muscles and soothe muscle aches. Cold therapy (ice packs) can be used to help swelling both after injury and after prolonged use of areas of chronic pain/aches.  Pain medication:  500 mg Naprosyn/Aleve (naproxen) every 12 hours with food:  AVOID other NSAIDs while taking this (may have Tylenol).  Important to follow up with specialist(s) below for further evaluation/management if your symptoms persist or worsen.    ED Prescriptions    Medication Sig Dispense Auth. Provider   naproxen (NAPROSYN) 500 MG tablet Take 1 tablet (500 mg total) by mouth 2 (two) times daily. 30 tablet Hall-Potvin, [place], PA-C     PDMP not reviewed this encounter.   Hall-Potvin, [place], [place] 02/01/20 1217

## 2020-02-01 NOTE — ED Triage Notes (Signed)
Pt reports R hand pain described as sharp pain between knuckles worsening x 1 month.  Pain worse with lifting or gripping.  No tx at home.  Sometimes knuckles swell. When pain hits, it is 9/10.

## 2020-03-25 ENCOUNTER — Other Ambulatory Visit: Payer: Self-pay

## 2020-03-25 ENCOUNTER — Ambulatory Visit
Admission: EM | Admit: 2020-03-25 | Discharge: 2020-03-25 | Disposition: A | Discharge status: Home / Self Care | Payer: BC Managed Care – PPO | Attending: Emergency Medicine | Admitting: Emergency Medicine

## 2020-03-25 ENCOUNTER — Encounter: Payer: Self-pay | Admitting: Emergency Medicine

## 2020-03-25 DIAGNOSIS — J069 Acute upper respiratory infection, unspecified: Secondary | ICD-10-CM | POA: Diagnosis not present

## 2020-03-25 DIAGNOSIS — Z20822 Contact with and (suspected) exposure to covid-19: Secondary | ICD-10-CM | POA: Diagnosis not present

## 2020-03-25 MED ORDER — BENZONATATE 100 MG PO CAPS: 100.0000 mg | ORAL_CAPSULE | Freq: Three times a day (TID) | ORAL | 0 refills | Status: DC

## 2020-03-25 MED ORDER — CETIRIZINE HCL 10 MG PO TABS: 10.0000 mg | ORAL_TABLET | Freq: Every day | ORAL | 0 refills | Status: DC

## 2020-03-25 MED ORDER — PREDNISONE 20 MG PO TABS: 20.0000 mg | ORAL_TABLET | Freq: Every day | ORAL | 0 refills | Status: DC

## 2020-03-25 MED ORDER — FLUTICASONE PROPIONATE 50 MCG/ACT NA SUSP: 1.0000 | Freq: Every day | NASAL | 0 refills | Status: DC

## 2020-03-25 NOTE — ED Triage Notes (Signed)
Pt here for cough and nasal congestion with chills x 3 days

## 2020-03-25 NOTE — Discharge Instructions (Signed)

## 2020-03-25 NOTE — ED Provider Notes (Signed)
[name]    CSN: [id number] Arrival date & time: 03/25/20  1100      History   Chief Complaint Chief Complaint  Patient presents with  . Cough    HPI Haley Cunningham is a 39 y.o. female  History was provided by the patient. Haley Cunningham is a 39 y.o. female who presents for evaluation of symptoms of a URI. Symptoms include nasal blockage, post nasal drip, productive cough, sinus and nasal congestion and sore throat. Onset of symptoms was 3 days ago, unchanged since that time. Associated symptoms include achiness, congestion, nasal congestion, post nasal drip and productive cough with  yellow colored sputum.  She is drinking plenty of fluids. Evaluation to date: none. Treatment to date: none The following portions of the patient's history were reviewed and updated as appropriate: allergies, current medications, past family history, past medical history, past social history, past surgical history and problem list.     History reviewed. No pertinent past medical history.  There are no problems to display for this patient.   History reviewed. No pertinent surgical history.  OB History   No obstetric history on file.      Home Medications    Prior to Admission medications   Medication Sig Start Date End Date Taking? Authorizing Provider  benzonatate (TESSALON) 100 MG capsule Take 1 capsule (100 mg total) by mouth every 8 (eight) hours. 03/25/20   Hall-Potvin, [place], PA-C  cetirizine (ZYRTEC ALLERGY) 10 MG tablet Take 1 tablet (10 mg total) by mouth daily. 03/25/20   Hall-Potvin, [place], PA-C  fluticasone (FLONASE) 50 MCG/ACT nasal spray Place 1 spray into both nostrils daily. 03/25/20   Hall-Potvin, [place], PA-C  naproxen (NAPROSYN) 500 MG tablet Take 1 tablet (500 mg total) by mouth 2 (two) times daily. 02/01/20   Hall-Potvin, [place], PA-C  predniSONE (DELTASONE) 20 MG tablet Take 1 tablet (20 mg total) by mouth daily. 03/25/20   Hall-Potvin,  [place], PA-C  ferrous fumarate (HEMOCYTE - 106 MG FE) 325 (106 FE) MG TABS tablet Take 1 tablet by mouth.  11/05/19  [provider]  pantoprazole (PROTONIX) 20 MG tablet Take 1 tablet (20 mg total) by mouth 2 (two) times daily for 30 days. 09/05/18 11/05/19  Merrilee Jansky, MD  phentermine (ADIPEX-P) 37.5 MG tablet Take 37.5 mg by mouth daily before breakfast.  02/01/20  [provider]    Family History Family History  Problem Relation Age of Onset  . Healthy Mother   . Healthy Father   . Diabetes Maternal Grandmother   . Diabetes Maternal Grandfather     Social History Social History   Tobacco Use  . Smoking status: Never Smoker  . Smokeless tobacco: Never Used  Vaping Use  . Vaping Use: Never used  Substance Use Topics  . Alcohol use: No    Alcohol/week: 0.0 standard drinks  . Drug use: No     Allergies   Patient has no known allergies.   Review of Systems Review of Systems  Constitutional: Positive for chills. Negative for fatigue and fever.  HENT: Positive for congestion and postnasal drip. Negative for dental problem, ear pain, facial swelling, hearing loss, sinus pain, sore throat, trouble swallowing and voice change.   Eyes: Negative for photophobia, pain, redness and visual disturbance.  Respiratory: Positive for cough. Negative for shortness of breath.   Cardiovascular: Negative for chest pain and palpitations.  Gastrointestinal: Negative for abdominal pain, diarrhea and vomiting.  Musculoskeletal: Positive for myalgias. Negative  for arthralgias.  Skin: Negative for rash and wound.  Neurological: Negative for dizziness, syncope and headaches.     Physical Exam Triage Vital Signs ED Triage Vitals  Enc Vitals Group     BP 03/25/20 1121 123/85     Pulse Rate 03/25/20 1121 99     Resp 03/25/20 1121 18     Temp 03/25/20 1121 98.4 F (36.9 C)     Temp src --      SpO2 03/25/20 1121 98 %     Weight --      Height --      Head  Circumference --      Peak Flow --      Pain Score 03/25/20 1122 5     Pain Loc --      Pain Edu? --      Excl. in GC? --    No data found.  Updated Vital Signs BP 123/85 (BP Location: Left Arm)   Pulse 99   Temp 98.4 F (36.9 C)   Resp 18   SpO2 98%   Visual Acuity Right Eye Distance:   Left Eye Distance:   Bilateral Distance:    Right Eye Near:   Left Eye Near:    Bilateral Near:     Physical Exam Constitutional:      General: She is not in acute distress.    Appearance: She is not ill-appearing or diaphoretic.  HENT:     Head: Normocephalic and atraumatic.     Right Ear: Tympanic membrane and ear canal normal.     Left Ear: Tympanic membrane and ear canal normal.     Mouth/Throat:     Mouth: Mucous membranes are moist.     Pharynx: Oropharynx is clear. No oropharyngeal exudate or posterior oropharyngeal erythema.  Eyes:     General: No scleral icterus.    Conjunctiva/sclera: Conjunctivae normal.     Pupils: Pupils are equal, round, and reactive to light.  Neck:     Comments: Trachea midline, negative JVD Cardiovascular:     Rate and Rhythm: Normal rate and regular rhythm.     Heart sounds: No murmur heard.  No gallop.   Pulmonary:     Effort: Pulmonary effort is normal. No respiratory distress.     Breath sounds: Wheezing and rhonchi present. No rales.     Comments: Mild, diffuse. Musculoskeletal:     Cervical back: Neck supple. No tenderness.  Lymphadenopathy:     Cervical: No cervical adenopathy.  Skin:    Capillary Refill: Capillary refill takes less than 2 seconds.     Coloration: Skin is not jaundiced or pale.     Findings: No rash.  Neurological:     General: No focal deficit present.     Mental Status: She is alert and oriented to person, place, and time.      UC Treatments / Results  Labs (all labs ordered are listed, but only abnormal results are displayed) Labs Reviewed  COVID-19, FLU A+B AND RSV    EKG   Radiology No results  found.  Procedures Procedures (including critical care time)  Medications Ordered in UC Medications - No data to display  Initial Impression / Assessment and Plan / UC Course  I have reviewed the triage vital signs and the nursing notes.  Pertinent labs & imaging results that were available during my care of the patient were reviewed by me and considered in my medical decision making (see chart for details).  Patient afebrile, nontoxic, with SpO2 98%.  Covid PCR pending.  Patient to quarantine until results are back.  We will treat supportively as outlined below.  Return precautions discussed, patient verbalized understanding and is agreeable to plan. Final Clinical Impressions(s) / UC Diagnoses   Final diagnoses:  Encounter for screening laboratory testing for COVID-19 virus  URI with cough and congestion     Discharge Instructions     Tessalon for cough. Start flonase, atrovent nasal spray for nasal congestion/drainage. You can use over the counter nasal saline rinse such as neti pot for nasal congestion. Keep hydrated, your urine should be clear to pale yellow in color. Tylenol/motrin for fever and pain. Monitor for any worsening of symptoms, chest pain, shortness of breath, wheezing, swelling of the throat, go to the emergency department for further evaluation needed.     ED Prescriptions    Medication Sig Dispense Auth. Provider   predniSONE (DELTASONE) 20 MG tablet Take 1 tablet (20 mg total) by mouth daily. 5 tablet Hall-Potvin, [place], PA-C   fluticasone (FLONASE) 50 MCG/ACT nasal spray Place 1 spray into both nostrils daily. 16 g Hall-Potvin, [place], PA-C   cetirizine (ZYRTEC ALLERGY) 10 MG tablet Take 1 tablet (10 mg total) by mouth daily. 30 tablet Hall-Potvin, [place], PA-C   benzonatate (TESSALON) 100 MG capsule Take 1 capsule (100 mg total) by mouth every 8 (eight) hours. 21 capsule Hall-Potvin, [place], PA-C     PDMP not reviewed this encounter.     Hall-Potvin, [place], [place] 03/25/20 1209

## 2020-03-27 LAB — COVID-19, FLU A+B AND RSV
Influenza A, NAA: NOT DETECTED
Influenza B, NAA: NOT DETECTED
RSV, NAA: NOT DETECTED
SARS-CoV-2, NAA: DETECTED — AB

## 2020-07-26 ENCOUNTER — Ambulatory Visit (INDEPENDENT_AMBULATORY_CARE_PROVIDER_SITE_OTHER): Payer: BC Managed Care – PPO

## 2020-07-26 ENCOUNTER — Ambulatory Visit
Admission: EM | Admit: 2020-07-26 | Discharge: 2020-07-26 | Disposition: A | Discharge status: Home / Self Care | Payer: BC Managed Care – PPO | Attending: Family Medicine | Admitting: Family Medicine

## 2020-07-26 ENCOUNTER — Other Ambulatory Visit: Payer: Self-pay

## 2020-07-26 DIAGNOSIS — W19XXXA Unspecified fall, initial encounter: Secondary | ICD-10-CM | POA: Diagnosis not present

## 2020-07-26 DIAGNOSIS — M25532 Pain in left wrist: Secondary | ICD-10-CM | POA: Diagnosis not present

## 2020-07-26 DIAGNOSIS — S6992XA Unspecified injury of left wrist, hand and finger(s), initial encounter: Secondary | ICD-10-CM

## 2020-07-26 NOTE — Discharge Instructions (Addendum)
Your wrist imaging is negative for fracture. Mechanism of injury is more consistent with that of a left wrist sprain.  Recommend RICE wrapping elevation and applying ice and take NSAIDs such as naproxen or ibuprofen as needed for pain.  If symptoms have not improved within the next 3 days recommend follow-up with [facility] for further evaluation of symptoms.

## 2020-07-26 NOTE — ED Triage Notes (Signed)
Pt states tripped and fell on her hardwood floors today landing on lt hand. C/o lt wrist pain.

## 2020-11-30 ENCOUNTER — Ambulatory Visit
Admission: EM | Admit: 2020-11-30 | Discharge: 2020-11-30 | Disposition: A | Discharge status: Home / Self Care | Payer: BC Managed Care – PPO | Attending: Family Medicine | Admitting: Family Medicine

## 2020-11-30 ENCOUNTER — Other Ambulatory Visit: Payer: Self-pay

## 2020-11-30 DIAGNOSIS — S29011A Strain of muscle and tendon of front wall of thorax, initial encounter: Secondary | ICD-10-CM | POA: Diagnosis not present

## 2020-11-30 HISTORY — DX: Type 2 diabetes mellitus without complications: E11.9

## 2020-11-30 MED ORDER — CYCLOBENZAPRINE HCL 10 MG PO TABS: 10.0000 mg | ORAL_TABLET | Freq: Three times a day (TID) | ORAL | 0 refills | Status: DC | PRN

## 2020-11-30 MED ORDER — PREDNISONE 20 MG PO TABS: 40.0000 mg | ORAL_TABLET | Freq: Every day | ORAL | 0 refills | Status: DC

## 2020-11-30 NOTE — ED Provider Notes (Signed)
[name]    CSN: [id number] Arrival date & time: 11/30/20  1730      History   Chief Complaint Chief Complaint  Patient presents with   Chest Pain    HPI Haley Cunningham is a 40 y.o. female.   Patient presenting today with severe sudden onset sharp left upper chest, shoulder and upper arm pain that started suddenly today at work while lifting a heavy crate.  States the pain since sitting here in clinic has started to radiate into the arm.  Denies numbness, weakness, sweats, nausea, vomiting, shortness of breath, dizziness, headache, history of cardiac issues.  So far has not tried anything over-the-counter for symptoms.  Has had a similar issue in the past from doing heavy lifting.   Past Medical History:  Diagnosis Date   Diabetes mellitus without complication (HCC)     There are no problems to display for this patient.   History reviewed. No pertinent surgical history.  OB History   No obstetric history on file.      Home Medications    Prior to Admission medications   Medication Sig Start Date End Date Taking? Authorizing Provider  cyclobenzaprine (FLEXERIL) 10 MG tablet Take 1 tablet (10 mg total) by mouth 3 (three) times daily as needed for muscle spasms. 11/30/20  Yes Particia Nearing, PA-C  predniSONE (DELTASONE) 20 MG tablet Take 2 tablets (40 mg total) by mouth daily with breakfast. 11/30/20  Yes Particia Nearing, PA-C  Barberry-Oreg Grape-Goldenseal (BERBERINE COMPLEX PO) Take by mouth.    [provider]  calcium-vitamin D (OSCAL WITH D) 500-200 MG-UNIT tablet Take 1 tablet by mouth.    [provider]  phentermine 30 MG capsule Take 30 mg by mouth every morning.    [provider]  ferrous fumarate (HEMOCYTE - 106 MG FE) 325 (106 FE) MG TABS tablet Take 1 tablet by mouth.  11/05/19  [provider]  pantoprazole (PROTONIX) 20 MG tablet Take 1 tablet (20 mg total) by mouth 2 (two) times daily for 30  days. 09/05/18 11/05/19  Lamptey, Britta Mccreedy, MD    Family History Family History  Problem Relation Age of Onset   Healthy Mother    Healthy Father    Diabetes Maternal Grandmother    Diabetes Maternal Grandfather     Social History Social History   Tobacco Use   Smoking status: Never   Smokeless tobacco: Never  Vaping Use   Vaping Use: Never used  Substance Use Topics   Alcohol use: Yes    Comment: occasionally   Drug use: No     Allergies   Patient has no known allergies.   Review of Systems Review of Systems Per HPI  Physical Exam Triage Vital Signs ED Triage Vitals  Enc Vitals Group     BP 11/30/20 1758 111/78     Pulse Rate 11/30/20 1758 94     Resp 11/30/20 1758 18     Temp 11/30/20 1758 97.8 F (36.6 C)     Temp Source 11/30/20 1758 Oral     SpO2 11/30/20 1758 98 %     Weight --      Height --      Head Circumference --      Peak Flow --      Pain Score 11/30/20 1759 8     Pain Loc --      Pain Edu? --      Excl. in GC? --  No data found.  Updated Vital Signs BP 111/78 (BP Location: Right Arm)   Pulse 94   Temp 97.8 F (36.6 C) (Oral)   Resp 18   LMP 11/21/2020 (Approximate)   SpO2 98%   Visual Acuity Right Eye Distance:   Left Eye Distance:   Bilateral Distance:    Right Eye Near:   Left Eye Near:    Bilateral Near:     Physical Exam Vitals and nursing note reviewed.  Constitutional:      Appearance: Normal appearance. She is not ill-appearing.  HENT:     Head: Atraumatic.     Mouth/Throat:     Mouth: Mucous membranes are moist.  Eyes:     Extraocular Movements: Extraocular movements intact.     Conjunctiva/sclera: Conjunctivae normal.  Cardiovascular:     Rate and Rhythm: Normal rate and regular rhythm.     Heart sounds: Normal heart sounds.  Pulmonary:     Effort: Pulmonary effort is normal.     Breath sounds: Normal breath sounds. No wheezing or rales.  Abdominal:     General: Bowel sounds are normal. There is no  distension.     Palpations: Abdomen is soft.     Tenderness: There is no abdominal tenderness. There is no guarding.  Musculoskeletal:        General: Tenderness present. No swelling. Normal range of motion.     Cervical back: Normal range of motion and neck supple.     Comments: Moderate tenderness to palpation left lateral pectoral region radiating up into left deltoid.  Palpation reproduces her sharp pain.  Range of motion against resistance with abduction of the left arm also reproducing her pain.  Skin:    General: Skin is warm and dry.     Findings: No erythema or rash.  Neurological:     Mental Status: She is alert and oriented to person, place, and time.     Motor: No weakness.     Gait: Gait normal.     Comments: Bilateral upper extremities neurovascularly intact  Psychiatric:        Mood and Affect: Mood normal.        Thought Content: Thought content normal.        Judgment: Judgment normal.     UC Treatments / Results  Labs (all labs ordered are listed, but only abnormal results are displayed) Labs Reviewed - No data to display  EKG   Radiology No results found.  Procedures Procedures (including critical care time)  Medications Ordered in UC Medications - No data to display  Initial Impression / Assessment and Plan / UC Course  I have reviewed the triage vital signs and the nursing notes.  Pertinent labs & imaging results that were available during my care of the patient were reviewed by me and considered in my medical decision making (see chart for details).     Onset of pain was with heavy lifting at work and pain is reproducible with palpation and range of motion against resistance.  EKG normal sinus rhythm at 90 bpm without acute ST or T wave changes, vital signs benign and reassuring, exam otherwise benign and reassuring.  Flexeril, prednisone, stretches, massage, rest recommended.  Work note given for rest.  Final Clinical Impressions(s) / UC  Diagnoses   Final diagnoses:  Pectoralis muscle strain, initial encounter   Discharge Instructions   None    ED Prescriptions     Medication Sig Dispense Auth. Provider   cyclobenzaprine (  FLEXERIL) 10 MG tablet Take 1 tablet (10 mg total) by mouth 3 (three) times daily as needed for muscle spasms. 30 tablet Particia Nearing, [place]   predniSONE (DELTASONE) 20 MG tablet Take 2 tablets (40 mg total) by mouth daily with breakfast. 10 tablet Particia Nearing, [place]      PDMP not reviewed this encounter.   Particia Nearing, [place] 11/30/20 [id number]

## 2020-11-30 NOTE — ED Triage Notes (Signed)
Pt c/o 8/10 sharp chest pain starting this afternoon while doing heavy lifting at work. States it does not radiate. Pt appears calm and coherent with no sweating, shakiness, nausea or vomiting. Vitals all stable. States this has happened before and she was told "my heart is skipping a beat." EKG performed here without obvious sign of cardiac stress.

## 2021-03-09 ENCOUNTER — Encounter (HOSPITAL_BASED_OUTPATIENT_CLINIC_OR_DEPARTMENT_OTHER): Payer: Self-pay | Admitting: Obstetrics and Gynecology

## 2021-03-09 ENCOUNTER — Emergency Department (HOSPITAL_BASED_OUTPATIENT_CLINIC_OR_DEPARTMENT_OTHER)
Admission: EM | Admit: 2021-03-09 | Discharge: 2021-03-09 | Disposition: A | Discharge status: Home / Self Care | Payer: BC Managed Care – PPO | Attending: Emergency Medicine | Admitting: Emergency Medicine

## 2021-03-09 ENCOUNTER — Ambulatory Visit
Admission: RE | Admit: 2021-03-09 | Discharge: 2021-03-09 | Disposition: A | Discharge status: Home / Self Care | Payer: PRIVATE HEALTH INSURANCE | Source: Ambulatory Visit

## 2021-03-09 ENCOUNTER — Other Ambulatory Visit: Payer: Self-pay

## 2021-03-09 ENCOUNTER — Emergency Department (HOSPITAL_BASED_OUTPATIENT_CLINIC_OR_DEPARTMENT_OTHER): Payer: BC Managed Care – PPO

## 2021-03-09 DIAGNOSIS — E119 Type 2 diabetes mellitus without complications: Secondary | ICD-10-CM | POA: Diagnosis not present

## 2021-03-09 DIAGNOSIS — E041 Nontoxic single thyroid nodule: Secondary | ICD-10-CM

## 2021-03-09 DIAGNOSIS — M542 Cervicalgia: Secondary | ICD-10-CM | POA: Diagnosis not present

## 2021-03-09 DIAGNOSIS — Z79899 Other long term (current) drug therapy: Secondary | ICD-10-CM | POA: Diagnosis not present

## 2021-03-09 DIAGNOSIS — M62838 Other muscle spasm: Secondary | ICD-10-CM | POA: Diagnosis not present

## 2021-03-09 DIAGNOSIS — Y92410 Unspecified street and highway as the place of occurrence of the external cause: Secondary | ICD-10-CM | POA: Diagnosis not present

## 2021-03-09 LAB — CBG MONITORING, ED: Glucose-Capillary: 97 mg/dL (ref 70–99)

## 2021-03-09 MED ORDER — LIDOCAINE 5 % EX PTCH
1.0000 | MEDICATED_PATCH | CUTANEOUS | Status: DC
  Administered 2021-03-09: 1 via TRANSDERMAL
  Filled 2021-03-09: qty 1

## 2021-03-09 MED ORDER — BACLOFEN 10 MG PO TABS: 10.0000 mg | ORAL_TABLET | Freq: Every day | ORAL | 0 refills | Status: AC

## 2021-03-09 MED ORDER — IBUPROFEN 600 MG PO TABS: 600.0000 mg | ORAL_TABLET | Freq: Four times a day (QID) | ORAL | 0 refills | Status: DC | PRN

## 2021-03-09 MED ORDER — IBUPROFEN 800 MG PO TABS
800.0000 mg | ORAL_TABLET | Freq: Once | ORAL | Status: AC
  Administered 2021-03-09: 800 mg via ORAL
  Filled 2021-03-09: qty 1

## 2021-03-09 MED ORDER — KETOROLAC TROMETHAMINE 60 MG/2ML IM SOLN
60.0000 mg | Freq: Once | INTRAMUSCULAR | Status: AC
  Administered 2021-03-09: 60 mg via INTRAMUSCULAR

## 2021-03-09 MED ORDER — LIDOCAINE 5 % EX PTCH: 1.0000 | MEDICATED_PATCH | CUTANEOUS | 0 refills | Status: DC

## 2021-03-09 MED ORDER — METHYLPREDNISOLONE 4 MG PO TBPK: ORAL_TABLET | ORAL | 0 refills | Status: DC
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
## 2021-03-09 NOTE — ED Notes (Signed)
Called out to waiting area, pt has questions about results and when she will be seen. Explained to patient that results are back (in my chart) as she asked,  and she is one of the longest waits in the lobby at this time. Explained to pt that I cannot give her a time frame of going to a treatment room at this time. Pt asking for a work note, explained she has to be seen by provider for a work to note to be given. Pt became loud "just go away, you are not even answering my question, just get out of my face and go away". Discussed with her I am happy to answer her questions, and will take her back when a room is available.

## 2021-03-09 NOTE — Discharge Instructions (Addendum)
You were provided with an injection of ketorolac which is a powerful anti-inflammatory pain medication that is nonsedating and not habit-forming, should provide you pain relief within the next 30 to 45 minutes.  To significantly reduce your inflammation, I recommend that you begin taking methylprednisolone, 1 row of tablets daily starting first thing tomorrow morning with your breakfast.  Please take all as prescribed.  If you continue to have pain in 3 to 5 days, please return to urgent care for repeat ketorolac injection.  Please go to the [facility] emergency room to advise him that you have an x-ray ordered.  You will not be admitted there is a patient so you will not be required to wait with other emergency room patients.  Once we receive the results of your x-ray we will contact you with any further recommendations.
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
## 2021-03-15 ENCOUNTER — Other Ambulatory Visit: Payer: Self-pay

## 2021-03-15 ENCOUNTER — Encounter (HOSPITAL_BASED_OUTPATIENT_CLINIC_OR_DEPARTMENT_OTHER): Payer: Self-pay

## 2021-03-15 ENCOUNTER — Emergency Department (HOSPITAL_BASED_OUTPATIENT_CLINIC_OR_DEPARTMENT_OTHER)
Admission: EM | Admit: 2021-03-15 | Discharge: 2021-03-16 | Disposition: A | Discharge status: Home / Self Care | Payer: BC Managed Care – PPO | Attending: Emergency Medicine | Admitting: Emergency Medicine

## 2021-03-15 DIAGNOSIS — N938 Other specified abnormal uterine and vaginal bleeding: Secondary | ICD-10-CM | POA: Insufficient documentation

## 2021-03-15 DIAGNOSIS — O209 Hemorrhage in early pregnancy, unspecified: Secondary | ICD-10-CM | POA: Diagnosis present

## 2021-03-15 DIAGNOSIS — N9489 Other specified conditions associated with female genital organs and menstrual cycle: Secondary | ICD-10-CM | POA: Diagnosis not present

## 2021-03-15 DIAGNOSIS — R109 Unspecified abdominal pain: Secondary | ICD-10-CM | POA: Insufficient documentation

## 2021-03-15 DIAGNOSIS — O26899 Other specified pregnancy related conditions, unspecified trimester: Secondary | ICD-10-CM | POA: Diagnosis not present

## 2021-03-15 DIAGNOSIS — E119 Type 2 diabetes mellitus without complications: Secondary | ICD-10-CM | POA: Diagnosis not present

## 2021-03-15 DIAGNOSIS — Z3A00 Weeks of gestation of pregnancy not specified: Secondary | ICD-10-CM | POA: Insufficient documentation

## 2021-03-15 DIAGNOSIS — N939 Abnormal uterine and vaginal bleeding, unspecified: Secondary | ICD-10-CM

## 2021-03-15 LAB — CBG MONITORING, ED: Glucose-Capillary: 96 mg/dL (ref 70–99)

## 2021-03-15 LAB — PREGNANCY, URINE: Preg Test, Ur: POSITIVE — AB

## 2021-03-15 NOTE — ED Triage Notes (Addendum)
Pt is a&o x 4 is cramping in the lower abdomen. Has been dizzy allday today

## 2021-03-16 ENCOUNTER — Emergency Department (HOSPITAL_BASED_OUTPATIENT_CLINIC_OR_DEPARTMENT_OTHER): Payer: BC Managed Care – PPO

## 2021-03-16 DIAGNOSIS — O209 Hemorrhage in early pregnancy, unspecified: Secondary | ICD-10-CM | POA: Diagnosis not present

## 2021-03-16 LAB — CBC WITH DIFFERENTIAL/PLATELET
Abs Immature Granulocytes: 0.04 10*3/uL (ref 0.00–0.07)
Basophils Absolute: 0 10*3/uL (ref 0.0–0.1)
Basophils Relative: 0 %
Eosinophils Absolute: 0.1 10*3/uL (ref 0.0–0.5)
Eosinophils Relative: 1 %
HCT: 33.9 % — ABNORMAL LOW (ref 36.0–46.0)
Hemoglobin: 10.7 g/dL — ABNORMAL LOW (ref 12.0–15.0)
Immature Granulocytes: 0 %
Lymphocytes Relative: 29 %
Lymphs Abs: 3 10*3/uL (ref 0.7–4.0)
MCH: 25.2 pg — ABNORMAL LOW (ref 26.0–34.0)
MCHC: 31.6 g/dL (ref 30.0–36.0)
MCV: 80 fL (ref 80.0–100.0)
Monocytes Absolute: 0.6 10*3/uL (ref 0.1–1.0)
Monocytes Relative: 6 %
Neutro Abs: 6.5 10*3/uL (ref 1.7–7.7)
Neutrophils Relative %: 64 %
Platelets: 390 10*3/uL (ref 150–400)
RBC: 4.24 MIL/uL (ref 3.87–5.11)
RDW: 14.6 % (ref 11.5–15.5)
WBC: 10.3 10*3/uL (ref 4.0–10.5)
nRBC: 0 % (ref 0.0–0.2)

## 2021-03-16 LAB — BASIC METABOLIC PANEL
Anion gap: 7 (ref 5–15)
BUN: 15 mg/dL (ref 6–20)
CO2: 25 mmol/L (ref 22–32)
Calcium: 9 mg/dL (ref 8.9–10.3)
Chloride: 105 mmol/L (ref 98–111)
Creatinine, Ser: 0.78 mg/dL (ref 0.44–1.00)
GFR, Estimated: >60 mL/min (ref >60)
Glucose, Bld: 94 mg/dL (ref 70–99)
Potassium: 4.1 mmol/L (ref 3.5–5.1)
Sodium: 137 mmol/L (ref 135–145)

## 2021-03-16 LAB — HCG, QUANTITATIVE, PREGNANCY: hCG, Beta Chain, Quant, S: 430 m[IU]/mL — ABNORMAL HIGH (ref <5)

## 2021-03-16 MED ORDER — ACETAMINOPHEN 500 MG PO TABS
1000.0000 mg | ORAL_TABLET | Freq: Once | ORAL | Status: AC
  Administered 2021-03-16: 1000 mg via ORAL
  Filled 2021-03-16: qty 2

## 2021-03-16 NOTE — ED Notes (Signed)
Patient transported to Ultrasound 

## 2021-03-16 NOTE — ED Notes (Signed)
Hcg quant is 429.6 per lab

## 2021-03-16 NOTE — ED Provider Notes (Signed)
[facility] EMERGENCY DEPT Provider Note   CSN: [id number] Arrival date & time: 03/15/21  2224     History Chief Complaint  Patient presents with   Abdominal Pain    Thinks she had a miscarriage yesterday, last menstrual period was end of last month, since had a positive home pregnancy test.  Started bleeding heavy yesterday but today it is spotty and has pain.    Haley Cunningham is a 40 y.o. female.  40 year old G2, P1 at unclear weeks of pregnancy the presents emerged from today secondary to abdominal cramping vaginal bleeding.  Patient states she had a home pregnancy test was positive is concerned that she is having a miscarriage.  She states that she started vaginal bleeding yesterday which is not normal for her so she took a pregnancy test which was positive.  She has no history of miscarriages or ectopics.  She states that she had a light period at the end of October but her last regular period was in of September.  She is sexually active. No other vaginal symptoms. Bleeding has almost stopped since yesterday. Cramping still present.    Abdominal Pain     Past Medical History:  Diagnosis Date   Diabetes mellitus without complication (HCC)     There are no problems to display for this patient.   History reviewed. No pertinent surgical history.   OB History     Gravida      Para      Term      Preterm      AB      Living  1      SAB      IAB      Ectopic      Multiple      Live Births  1           Family History  Problem Relation Age of Onset   Healthy Mother    Healthy Father    Diabetes Maternal Grandmother    Diabetes Maternal Grandfather     Social History   Tobacco Use   Smoking status: Never   Smokeless tobacco: Never  Vaping Use   Vaping Use: Never used  Substance Use Topics   Alcohol use: Yes    Comment: occasionally   Drug use: No    Home Medications Prior to Admission medications   Medication Sig  Start Date End Date Taking? Authorizing Provider  baclofen (LIORESAL) 10 MG tablet Take 1 tablet (10 mg total) by mouth at bedtime for 7 days. 03/09/21 03/16/21  Theadora Rama Scales, PA-C  Barberry-Oreg Grape-Goldenseal (BERBERINE COMPLEX PO) Take by mouth.    [provider]  calcium-vitamin D (OSCAL WITH D) 500-200 MG-UNIT tablet Take 1 tablet by mouth.    [provider]  ibuprofen (ADVIL) 600 MG tablet Take 1 tablet (600 mg total) by mouth every 6 (six) hours as needed. 03/09/21   Palumbo, April, MD  lidocaine (LIDODERM) 5 % Place 1 patch onto the skin daily. Remove & Discard patch within 12 hours or as directed by MD 03/09/21   Nicanor Alcon, April, MD  methylPREDNISolone (MEDROL DOSEPAK) 4 MG TBPK tablet Take 24 mg on day 1, 20 mg on day 2, 16 mg on day 3, 12 mg on day 4, 8 mg on day 5, 4 mg on day 6. 03/09/21   Theadora Rama Scales, PA-C  MOUNJARO 2.5 MG/0.5ML Pen Inject into the skin. 03/03/21   [provider]  phentermine 37.5 MG  capsule Take 37.5 mg by mouth daily. 02/01/21   [provider]  ferrous fumarate (HEMOCYTE - 106 MG FE) 325 (106 FE) MG TABS tablet Take 1 tablet by mouth.  11/05/19  [provider]  pantoprazole (PROTONIX) 20 MG tablet Take 1 tablet (20 mg total) by mouth 2 (two) times daily for 30 days. 09/05/18 11/05/19  LampteyBritta Mccreedy, MD    Allergies    Patient has no known allergies.  Review of Systems   Review of Systems  Gastrointestinal:  Positive for abdominal pain.  All other systems reviewed and are negative.  Physical Exam Updated Vital Signs BP 113/67   Pulse 79   Temp 98.4 F (36.9 C) (Oral)   Resp 16   Ht 5\' 7"  (1.702 m)   Wt 96.6 kg   LMP 02/25/2021 (Approximate) Comment: positive home pregnancy test  SpO2 100%   BMI 33.36 kg/m   Physical Exam Vitals and nursing note reviewed.  Constitutional:      Appearance: She is well-developed.  HENT:     Head: Normocephalic and atraumatic.  Cardiovascular:      Rate and Rhythm: Normal rate and regular rhythm.  Pulmonary:     Effort: No respiratory distress.     Breath sounds: No stridor.  Abdominal:     General: Abdomen is flat. Bowel sounds are normal. There is no distension.  Musculoskeletal:     Cervical back: Normal range of motion.  Skin:    General: Skin is warm and dry.  Neurological:     General: No focal deficit present.     Mental Status: She is alert.    ED Results / Procedures / Treatments   Labs (all labs ordered are listed, but only abnormal results are displayed) Labs Reviewed  PREGNANCY, URINE - Abnormal; Notable for the following components:      Result Value   Preg Test, Ur POSITIVE (*)    All other components within normal limits  CBC WITH DIFFERENTIAL/PLATELET - Abnormal; Notable for the following components:   Hemoglobin 10.7 (*)    HCT 33.9 (*)    MCH 25.2 (*)    All other components within normal limits  HCG, QUANTITATIVE, PREGNANCY  BASIC METABOLIC PANEL  CBG MONITORING, ED    EKG None  Radiology No results found.  Procedures Procedures   Medications Ordered in ED Medications  acetaminophen (TYLENOL) tablet 1,000 mg (1,000 mg Oral Given 03/16/21 0038)    ED Course  I have reviewed the triage vital signs and the nursing notes.  Pertinent labs & imaging results that were available during my care of the patient were reviewed by me and considered in my medical decision making (see chart for details).    MDM Rules/Calculators/A&P                         Eval for IUP vs early miscarriage.   Hcg too low to fully evaluate for viability of pregnancy. Will get recheck in 2 weeks with Ob.   Final Clinical Impression(s) / ED Diagnoses Final diagnoses:  Vaginal bleeding    Rx / DC Orders ED Discharge Orders     None        Biruk Troia, 03/18/21, MD 03/16/21 [phone]

## 2021-04-05 ENCOUNTER — Other Ambulatory Visit: Payer: Self-pay

## 2021-04-05 ENCOUNTER — Emergency Department (HOSPITAL_BASED_OUTPATIENT_CLINIC_OR_DEPARTMENT_OTHER)
Admission: EM | Admit: 2021-04-05 | Discharge: 2021-04-05 | Disposition: A | Discharge status: Home / Self Care | Payer: BC Managed Care – PPO | Attending: Emergency Medicine | Admitting: Emergency Medicine

## 2021-04-05 ENCOUNTER — Emergency Department (HOSPITAL_BASED_OUTPATIENT_CLINIC_OR_DEPARTMENT_OTHER): Payer: BC Managed Care – PPO

## 2021-04-05 ENCOUNTER — Encounter (HOSPITAL_BASED_OUTPATIENT_CLINIC_OR_DEPARTMENT_OTHER): Payer: Self-pay | Admitting: *Deleted

## 2021-04-05 DIAGNOSIS — N9489 Other specified conditions associated with female genital organs and menstrual cycle: Secondary | ICD-10-CM | POA: Insufficient documentation

## 2021-04-05 DIAGNOSIS — Z3A01 Less than 8 weeks gestation of pregnancy: Secondary | ICD-10-CM | POA: Insufficient documentation

## 2021-04-05 DIAGNOSIS — E119 Type 2 diabetes mellitus without complications: Secondary | ICD-10-CM | POA: Insufficient documentation

## 2021-04-05 DIAGNOSIS — N939 Abnormal uterine and vaginal bleeding, unspecified: Secondary | ICD-10-CM

## 2021-04-05 DIAGNOSIS — O039 Complete or unspecified spontaneous abortion without complication: Secondary | ICD-10-CM

## 2021-04-05 DIAGNOSIS — O209 Hemorrhage in early pregnancy, unspecified: Secondary | ICD-10-CM | POA: Diagnosis present

## 2021-04-05 LAB — COMPREHENSIVE METABOLIC PANEL
ALT: 8 U/L (ref 0–44)
AST: 13 U/L — ABNORMAL LOW (ref 15–41)
Albumin: 4.5 g/dL (ref 3.5–5.0)
Alkaline Phosphatase: 85 U/L (ref 38–126)
Anion gap: 7 (ref 5–15)
BUN: 14 mg/dL (ref 6–20)
CO2: 25 mmol/L (ref 22–32)
Calcium: 9.7 mg/dL (ref 8.9–10.3)
Chloride: 96 mmol/L — ABNORMAL LOW (ref 98–111)
Creatinine, Ser: 0.74 mg/dL (ref 0.44–1.00)
GFR, Estimated: >60 mL/min (ref >60)
Glucose, Bld: 83 mg/dL (ref 70–99)
Potassium: 3.6 mmol/L (ref 3.5–5.1)
Sodium: 128 mmol/L — ABNORMAL LOW (ref 135–145)
Total Bilirubin: 0.7 mg/dL (ref 0.3–1.2)
Total Protein: 8.1 g/dL (ref 6.5–8.1)

## 2021-04-05 LAB — TROPONIN I (HIGH SENSITIVITY): Troponin I (High Sensitivity): 2 ng/L (ref <18)

## 2021-04-05 LAB — CBC
HCT: 37.5 % (ref 36.0–46.0)
Hemoglobin: 11.8 g/dL — ABNORMAL LOW (ref 12.0–15.0)
MCH: 24.9 pg — ABNORMAL LOW (ref 26.0–34.0)
MCHC: 31.5 g/dL (ref 30.0–36.0)
MCV: 79.1 fL — ABNORMAL LOW (ref 80.0–100.0)
Platelets: 362 10*3/uL (ref 150–400)
RBC: 4.74 MIL/uL (ref 3.87–5.11)
RDW: 14.3 % (ref 11.5–15.5)
WBC: 9.6 10*3/uL (ref 4.0–10.5)
nRBC: 0 % (ref 0.0–0.2)

## 2021-04-05 LAB — WET PREP, GENITAL
Sperm: NONE SEEN
Trich, Wet Prep: NONE SEEN
WBC, Wet Prep HPF POC: <10 (ref <10)
Yeast Wet Prep HPF POC: NONE SEEN

## 2021-04-05 LAB — HCG, QUANTITATIVE, PREGNANCY: hCG, Beta Chain, Quant, S: 15 m[IU]/mL — ABNORMAL HIGH (ref <5)

## 2021-04-05 NOTE — ED Triage Notes (Signed)
[redacted] weeks gestation, having abd pain, passing blood clots, onset yesterday.

## 2021-04-05 NOTE — ED Provider Notes (Signed)
[facility] EMERGENCY DEPT Provider Note   CSN: [id number] Arrival date & time: 04/05/21  1830     History Chief Complaint  Patient presents with   Abdominal Pain    Chestine R Mclees is a 40 y.o. female.  40 year old female presents today for evaluation of abdominal pain, vaginal bleeding in the setting of pregnancy.  She reports she is [redacted] weeks pregnant.  She also reports this morning she had some left-sided chest pain.  She reports this is her second pregnancy.  Her first pregnancy was 22 years ago without any complications.  Her abdominal pain and bleeding started yesterday and have been constant since today.  The history is provided by the patient. No language interpreter was used.      Past Medical History:  Diagnosis Date   Diabetes mellitus without complication (HCC)     There are no problems to display for this patient.   History reviewed. No pertinent surgical history.   OB History     Gravida      Para      Term      Preterm      AB      Living  1      SAB      IAB      Ectopic      Multiple      Live Births  1           Family History  Problem Relation Age of Onset   Healthy Mother    Healthy Father    Diabetes Maternal Grandmother    Diabetes Maternal Grandfather     Social History   Tobacco Use   Smoking status: Never   Smokeless tobacco: Never  Vaping Use   Vaping Use: Never used  Substance Use Topics   Alcohol use: Yes    Comment: occasionally   Drug use: No    Home Medications Prior to Admission medications   Medication Sig Start Date End Date Taking? Authorizing Provider  Barberry-Oreg Grape-Goldenseal (BERBERINE COMPLEX PO) Take by mouth.    [provider]  calcium-vitamin D (OSCAL WITH D) 500-200 MG-UNIT tablet Take 1 tablet by mouth.    [provider]  ibuprofen (ADVIL) 600 MG tablet Take 1 tablet (600 mg total) by mouth every 6 (six) hours as needed. 03/09/21   Palumbo,  April, MD  lidocaine (LIDODERM) 5 % Place 1 patch onto the skin daily. Remove & Discard patch within 12 hours or as directed by MD 03/09/21   Nicanor Alcon, April, MD  methylPREDNISolone (MEDROL DOSEPAK) 4 MG TBPK tablet Take 24 mg on day 1, 20 mg on day 2, 16 mg on day 3, 12 mg on day 4, 8 mg on day 5, 4 mg on day 6. 03/09/21   Theadora Rama Scales, PA-C  MOUNJARO 2.5 MG/0.5ML Pen Inject into the skin. 03/03/21   [provider]  phentermine 37.5 MG capsule Take 37.5 mg by mouth daily. 02/01/21   [provider]  ferrous fumarate (HEMOCYTE - 106 MG FE) 325 (106 FE) MG TABS tablet Take 1 tablet by mouth.  11/05/19  [provider]  pantoprazole (PROTONIX) 20 MG tablet Take 1 tablet (20 mg total) by mouth 2 (two) times daily for 30 days. 09/05/18 11/05/19  LampteyBritta Mccreedy, MD    Allergies    Patient has no known allergies.  Review of Systems   Review of Systems  Constitutional:  Negative for activity change, chills and fever.  Respiratory:  Negative for shortness of breath.   Cardiovascular:  Positive for chest pain.  Gastrointestinal:  Positive for abdominal pain. Negative for nausea.  Musculoskeletal:  Positive for back pain.  Neurological:  Negative for light-headedness.  All other systems reviewed and are negative.  Physical Exam Updated Vital Signs BP 117/76 (BP Location: Right Arm)   Pulse 92   Temp 99 F (37.2 C)   Resp 16   Ht 5\' 7"  (1.702 m)   Wt 94.8 kg   SpO2 98%   BMI 32.73 kg/m   Physical Exam Vitals and nursing note reviewed. Exam conducted with a chaperone present.  Constitutional:      General: She is not in acute distress.    Appearance: Normal appearance. She is not ill-appearing.  HENT:     Head: Normocephalic and atraumatic.     Nose: Nose normal.  Eyes:     General: No scleral icterus.    Extraocular Movements: Extraocular movements intact.     Conjunctiva/sclera: Conjunctivae normal.  Cardiovascular:     Rate and Rhythm: Normal  rate and regular rhythm.     Pulses: Normal pulses.     Heart sounds: Normal heart sounds.  Pulmonary:     Effort: Pulmonary effort is normal. No respiratory distress.     Breath sounds: Normal breath sounds. No wheezing or rales.  Abdominal:     General: There is no distension.     Tenderness: There is no abdominal tenderness.  Genitourinary:    General: Normal vulva.     Exam position: Lithotomy position.     Labia:        Right: No injury.        Left: No injury.      Vagina: Normal.     Adnexa: Right adnexa normal and left adnexa normal.     Comments: Significant blood present in vaginal vault.  Musculoskeletal:        General: Normal range of motion.     Cervical back: Normal range of motion.  Skin:    General: Skin is warm and dry.  Neurological:     General: No focal deficit present.     Mental Status: She is alert. Mental status is at baseline.    ED Results / Procedures / Treatments   Labs (all labs ordered are listed, but only abnormal results are displayed) Labs Reviewed  CBC - Abnormal; Notable for the following components:      Result Value   Hemoglobin 11.8 (*)    MCV 79.1 (*)    MCH 24.9 (*)    All other components within normal limits  HCG, QUANTITATIVE, PREGNANCY  COMPREHENSIVE METABOLIC PANEL  TROPONIN I (HIGH SENSITIVITY)    EKG EKG Interpretation  Date/Time:  Wednesday April 05 2021 18:54:18 EST Ventricular Rate:  84 PR Interval:  148 QRS Duration: 80 QT Interval:  362 QTC Calculation: 427 R Axis:   54 Text Interpretation: Normal sinus rhythm Nonspecific T wave abnormality No previous tracing Confirmed by 04-14-1983 (Gwyneth Sprout) on 04/05/2021 7:16:06 PM  Radiology No results found.  Procedures Procedures   Medications Ordered in ED Medications - No data to display  ED Course  I have reviewed the triage vital signs and the nursing notes.  Pertinent labs & imaging results that were available during my care of the patient were  reviewed by me and considered in my medical decision making (see chart for details).    MDM Rules/Calculators/A&P  40 year old female presents today for evaluation of abdominal pain and vaginal bleeding since yesterday.  She denies fever, chills, nausea, vomiting.  She reports she has been passing clots.  She reports she is [redacted] weeks pregnant.  Patient was evaluated for vaginal bleeding on 11/16 her hCG was low at the time to evaluate for viability of pregnancy and was referred to outpatient follow-up with OB.  She has not had this follow-up yet.   Patient's work-up today significant for hemoglobin of 11.8 which is increased from 10.7.  Sodium of 128 decreased from 137, CMP is otherwise unremarkable.  hCG 15 decreased from 430 2 weeks ago.  OB ultrasound significant for no visible intrauterine pregnancy consistent with spontaneous miscarriage.  Discussed importance with her regarding follow-up with OB in a couple days for serial hCG and further monitoring.  Patient is established with [facility] for her PCP needs.  She will follow-up with them for repeat chemistry panel.  Patient voices understanding and is in agreement with plan.  Final Clinical Impression(s) / ED Diagnoses Final diagnoses:  None    Rx / DC Orders ED Discharge Orders     None        Marita Kansas, PA-C 04/05/21 2212    Gwyneth Sprout, MD 04/08/21 [phone]

## 2021-04-05 NOTE — Discharge Instructions (Addendum)
You likely have had a spontaneous miscarriage.  I have attached information for OB.  Please give them a call to schedule a follow-up appointment for further monitoring.  You will need further blood work and potentially a repeat ultrasound.  Continue taking Tylenol and Motrin for your abdominal cramps.  Your blood work today showed low sodium at 128.  Please follow-up with your primary care provider to have this repeated within the week.

## 2021-04-07 LAB — GC/CHLAMYDIA PROBE AMP (~~LOC~~) NOT AT ARMC
Chlamydia: NEGATIVE
Comment: NEGATIVE
Comment: NORMAL
Neisseria Gonorrhea: NEGATIVE

## 2021-06-24 ENCOUNTER — Ambulatory Visit: Payer: Self-pay

## 2021-11-01 ENCOUNTER — Encounter (HOSPITAL_COMMUNITY): Payer: Self-pay

## 2021-11-01 ENCOUNTER — Ambulatory Visit (HOSPITAL_COMMUNITY)
Admission: RE | Admit: 2021-11-01 | Discharge: 2021-11-01 | Disposition: A | Discharge status: Home / Self Care | Payer: BC Managed Care – PPO | Source: Ambulatory Visit | Attending: Family Medicine | Admitting: Family Medicine

## 2021-11-01 ENCOUNTER — Ambulatory Visit: Payer: Self-pay

## 2021-11-01 VITALS — BP 126/71 | HR 79 | Temp 98.6°F | Resp 18

## 2021-11-01 DIAGNOSIS — L03315 Cellulitis of perineum: Secondary | ICD-10-CM | POA: Diagnosis not present

## 2021-11-01 MED ORDER — AMOXICILLIN-POT CLAVULANATE 875-125 MG PO TABS: 1.0000 | ORAL_TABLET | Freq: Two times a day (BID) | ORAL | 0 refills | Status: AC

## 2021-11-01 MED ORDER — CLOTRIMAZOLE 1 % VA CREA: 1.0000 | TOPICAL_CREAM | Freq: Every day | VAGINAL | 0 refills | Status: AC

## 2021-11-01 NOTE — ED Triage Notes (Signed)
Pt c/o vaginal irritation and swelling after using a new body wash yesterday.

## 2021-11-01 NOTE — ED Provider Notes (Signed)
[facility]    CSN: [id number] Arrival date & time: 11/01/21  0920      History   Chief Complaint Chief Complaint  Patient presents with   Vaginal Itching    HPI Haley Cunningham is a 41 y.o. female.    Vaginal Itching   Here for irritation of her vaginal introitus and swelling and discomfort of her right labia majora and minora.  This began yesterday.  No fever or chills or vomiting.  Last menstrual period was 3 weeks ago  Past Medical History:  Diagnosis Date   Diabetes mellitus without complication (HCC)     There are no problems to display for this patient.   History reviewed. No pertinent surgical history.  OB History     Gravida      Para      Term      Preterm      AB      Living  1      SAB      IAB      Ectopic      Multiple      Live Births  1            Home Medications    Prior to Admission medications   Medication Sig Start Date End Date Taking? Authorizing Provider  amoxicillin-clavulanate (AUGMENTIN) 875-125 MG tablet Take 1 tablet by mouth 2 (two) times daily for 7 days. 11/01/21 11/08/21 Yes Juelz Claar, Janace Aris, MD  clotrimazole (GYNE-LOTRIMIN) 1 % vaginal cream Place 1 Applicatorful vaginally at bedtime for 5 days. 11/01/21 11/06/21 Yes Vina Byrd, Janace Aris, MD  Barberry-Oreg Grape-Goldenseal (BERBERINE COMPLEX PO) Take by mouth.    [provider]  calcium-vitamin D (OSCAL WITH D) 500-200 MG-UNIT tablet Take 1 tablet by mouth.    [provider]  lidocaine (LIDODERM) 5 % Place 1 patch onto the skin daily. Remove & Discard patch within 12 hours or as directed by MD 03/09/21   Nicanor Alcon, April, MD  [facility] 2.5 MG/0.5ML Pen Inject into the skin. 03/03/21   [provider]  phentermine 37.5 MG capsule Take 37.5 mg by mouth daily. 02/01/21   [provider]  ferrous fumarate (HEMOCYTE - 106 MG FE) 325 (106 FE) MG TABS tablet Take 1 tablet by mouth.  11/05/19  [provider]   pantoprazole (PROTONIX) 20 MG tablet Take 1 tablet (20 mg total) by mouth 2 (two) times daily for 30 days. 09/05/18 11/05/19  Lamptey, Britta Mccreedy, MD    Family History Family History  Problem Relation Age of Onset   Healthy Mother    Healthy Father    Diabetes Maternal Grandmother    Diabetes Maternal Grandfather     Social History Social History   Tobacco Use   Smoking status: Never   Smokeless tobacco: Never  Vaping Use   Vaping Use: Never used  Substance Use Topics   Alcohol use: Yes    Comment: occasionally   Drug use: No     Allergies   Patient has no known allergies.   Review of Systems Review of Systems   Physical Exam Triage Vital Signs ED Triage Vitals  Enc Vitals Group     BP 11/01/21 0954 126/71     Pulse Rate 11/01/21 0954 79     Resp 11/01/21 0954 18     Temp 11/01/21 0954 98.6 F (37 C)     Temp Source 11/01/21 0954 Oral     SpO2 11/01/21 0954 100 %  Weight --      Height --      Head Circumference --      Peak Flow --      Pain Score 11/01/21 0955 0     Pain Loc --      Pain Edu? --      Excl. in GC? --    No data found.  Updated Vital Signs BP 126/71 (BP Location: Left Arm)   Pulse 79   Temp 98.6 F (37 C) (Oral)   Resp 18   LMP 10/02/2021   SpO2 100%   Visual Acuity Right Eye Distance:   Left Eye Distance:   Bilateral Distance:    Right Eye Near:   Left Eye Near:    Bilateral Near:     Physical Exam Vitals reviewed.  Constitutional:      General: She is not in acute distress.    Appearance: She is not toxic-appearing.  Genitourinary:    Comments: There are no vesicular lesions.  There is some mild swelling of the posterior portion of the labia minora and tenderness.  No fluctuance.  The vaginal mucosa is erythematous going in towards the introitus.  No discharge. Skin:    Coloration: Skin is not pale.  Neurological:     Mental Status: She is alert and oriented to person, place, and time.      UC Treatments /  Results  Labs (all labs ordered are listed, but only abnormal results are displayed) Labs Reviewed - No data to display  EKG   Radiology No results found.  Procedures Procedures (including critical care time)  Medications Ordered in UC Medications - No data to display  Initial Impression / Assessment and Plan / UC Course  I have reviewed the triage vital signs and the nursing notes.  Pertinent labs & imaging results that were available during my care of the patient were reviewed by me and considered in my medical decision making (see chart for details).     I am going to treat for possible early cellulitis.  I do not see any thing indicating she has herpes lesions.  Also some Gyne-Lotrimin since that area is so red Final Clinical Impressions(s) / UC Diagnoses   Final diagnoses:  Cellulitis of perineum     Discharge Instructions      Take amoxicillin-clavulanate 875 mg--1 tab twice daily with food for 7 days  Use the Gyne-Lotrimin nightly for 5 days.       ED Prescriptions     Medication Sig Dispense Auth. Provider   amoxicillin-clavulanate (AUGMENTIN) 875-125 MG tablet Take 1 tablet by mouth 2 (two) times daily for 7 days. 14 tablet Emmett Bracknell, Janace Aris, MD   clotrimazole (GYNE-LOTRIMIN) 1 % vaginal cream Place 1 Applicatorful vaginally at bedtime for 5 days. 45 g Zenia Resides, MD      PDMP not reviewed this encounter.   Zenia Resides, MD 11/01/21 1012

## 2021-11-01 NOTE — Discharge Instructions (Addendum)
Take amoxicillin-clavulanate 875 mg--1 tab twice daily with food for 7 days  Use the Gyne-Lotrimin nightly for 5 days.

## 2021-11-28 ENCOUNTER — Ambulatory Visit
Admission: RE | Admit: 2021-11-28 | Discharge: 2021-11-28 | Disposition: A | Discharge status: Home / Self Care | Payer: BC Managed Care – PPO | Source: Ambulatory Visit | Attending: Urgent Care | Admitting: Urgent Care

## 2021-11-28 VITALS — BP 139/89 | HR 91 | Temp 99.0°F | Resp 20

## 2021-11-28 DIAGNOSIS — K08409 Partial loss of teeth, unspecified cause, unspecified class: Secondary | ICD-10-CM | POA: Diagnosis not present

## 2021-11-28 DIAGNOSIS — K0889 Other specified disorders of teeth and supporting structures: Secondary | ICD-10-CM | POA: Diagnosis not present

## 2021-11-28 MED ORDER — KETOROLAC TROMETHAMINE 60 MG/2ML IM SOLN
60.0000 mg | Freq: Once | INTRAMUSCULAR | Status: AC
  Administered 2021-11-28: 60 mg via INTRAMUSCULAR

## 2021-11-28 MED ORDER — NAPROXEN 500 MG PO TABS
500.0000 mg | ORAL_TABLET | Freq: Two times a day (BID) | ORAL | 0 refills | Status: DC
Start: 2021-11-28 — End: 2022-01-05

## 2021-11-28 MED ORDER — AMOXICILLIN-POT CLAVULANATE 875-125 MG PO TABS: 1.0000 | ORAL_TABLET | Freq: Two times a day (BID) | ORAL | 0 refills | Status: DC

## 2021-11-28 NOTE — ED Provider Notes (Signed)
[facility]   MRN: [id number] DOB: Sep 08, 1980  Subjective:   Haley Cunningham is a 41 y.o. female presenting for 1 day history of acute onset right-sided jaw pain with swelling, posterior wisdom tooth pain.  Patient states that it was supposed to have been extracted and she believes it was done about a year ago.  She has had problems since then.  Has been using ibuprofen with minimal relief.  She does have a history of diabetes but is not treated with insulin.  No ear pain, fevers, drainage of pus or bleeding.  No current facility-administered medications for this encounter.  Current Outpatient Medications:    Barberry-Oreg Grape-Goldenseal (BERBERINE COMPLEX PO), Take by mouth., Disp: , Rfl:    calcium-vitamin D (OSCAL WITH D) 500-200 MG-UNIT tablet, Take 1 tablet by mouth., Disp: , Rfl:    lidocaine (LIDODERM) 5 %, Place 1 patch onto the skin daily. Remove & Discard patch within 12 hours or as directed by MD, Disp: 30 patch, Rfl: 0   MOUNJARO 2.5 MG/0.5ML Pen, Inject into the skin., Disp: , Rfl:    phentermine 37.5 MG capsule, Take 37.5 mg by mouth daily., Disp: , Rfl:    No Known Allergies  Past Medical History:  Diagnosis Date   Diabetes mellitus without complication (HCC)      No past surgical history on file.  Family History  Problem Relation Age of Onset   Healthy Mother    Healthy Father    Diabetes Maternal Grandmother    Diabetes Maternal Grandfather     Social History   Tobacco Use   Smoking status: Never   Smokeless tobacco: Never  Vaping Use   Vaping Use: Never used  Substance Use Topics   Alcohol use: Yes    Comment: occasionally   Drug use: No    ROS   Objective:   Vitals: BP 139/89   Pulse 91   Temp 99 F (37.2 C)   Resp 20   LMP 10/02/2021   SpO2 98%   Physical Exam Constitutional:      General: She is not in acute distress.    Appearance: Normal appearance. She is well-developed. She is not ill-appearing,  toxic-appearing or diaphoretic.  HENT:     Head: Normocephalic and atraumatic.     Right Ear: Tympanic membrane, ear canal and external ear normal. No tenderness. There is no impacted cerumen. Tympanic membrane is not injected, perforated, erythematous or bulging.     Left Ear: Tympanic membrane, ear canal and external ear normal. No tenderness. There is no impacted cerumen. Tympanic membrane is not injected, perforated, erythematous or bulging.     Nose: Nose normal.     Mouth/Throat:     Mouth: Mucous membranes are moist.     Dentition: Abnormal dentition. Gingival swelling (posteriorly over right lower side) present. No dental tenderness, dental caries, dental abscesses or gum lesions.   Eyes:     General: No scleral icterus.       Right eye: No discharge.        Left eye: No discharge.     Extraocular Movements: Extraocular movements intact.  Cardiovascular:     Rate and Rhythm: Normal rate.  Pulmonary:     Effort: Pulmonary effort is normal.  Skin:    General: Skin is warm and dry.  Neurological:     General: No focal deficit present.     Mental Status: She is alert and oriented to person, place, and  time.  Psychiatric:        Mood and Affect: Mood normal.        Behavior: Behavior normal.    IM Toradol in clinic at 60 mg.  Assessment and Plan :   PDMP not reviewed this encounter.  1. Pain, dental   2. History of third molar tooth extraction, unspecified edentulism class    Start Augmentin for dental infection/abscess, use naproxen for pain and inflammation. Emphasized need for dental surgeon consult I provided her with information for this. Counseled patient on potential for adverse effects with medications prescribed/recommended today, strict ER and return-to-clinic precautions discussed, patient verbalized understanding.    Wallis Bamberg, [place] 11/28/21 1713

## 2021-11-28 NOTE — Discharge Instructions (Signed)
Make sure you schedule an appointment with a dentist/dental surgeon as soon as possible.  You may try some of the resources below.  For now start taking Augmentin to help against an oral/dental infection. Use naproxen for pain and inflammation.   [facility] [place] Address: [address] [place], [place], [place] [address] Phone: [phone]  [facility] [phone] extension [phone] [address]  Dr. Lawrence Marseilles [phone] [address]  [place] [phone] [address] [facility] [place].  [facility] [phone] extension [phone], [place], [place], [address] First come first serve for the first 10 clients.  May do simple extractions only, no wisdom teeth or surgery.  You may try the second for Thursday of the month starting at 6:30 AM.  [facility] You may call the school to see if they are still helping to provide dental care for emergent cases.

## 2021-11-28 NOTE — ED Triage Notes (Addendum)
Pt here with throbbing right sided jaw pain and swelling and mouth pain since yesterday. Pt does not have that wisdom tooth. Pt had 800mg  of ibuprofen an hour ago.

## 2022-01-05 ENCOUNTER — Ambulatory Visit (HOSPITAL_COMMUNITY)
Admission: RE | Admit: 2022-01-05 | Discharge: 2022-01-05 | Disposition: A | Discharge status: Home / Self Care | Payer: BC Managed Care – PPO | Source: Ambulatory Visit | Attending: Emergency Medicine | Admitting: Emergency Medicine

## 2022-01-05 ENCOUNTER — Encounter (HOSPITAL_COMMUNITY): Payer: Self-pay

## 2022-01-05 VITALS — BP 113/77 | HR 107 | Temp 98.3°F | Resp 17

## 2022-01-05 DIAGNOSIS — A059 Bacterial foodborne intoxication, unspecified: Secondary | ICD-10-CM

## 2022-01-05 DIAGNOSIS — K529 Noninfective gastroenteritis and colitis, unspecified: Secondary | ICD-10-CM | POA: Diagnosis not present

## 2022-01-05 MED ORDER — ONDANSETRON 4 MG PO TBDP
4.0000 mg | ORAL_TABLET | Freq: Once | ORAL | Status: AC
  Administered 2022-01-05: 4 mg via ORAL

## 2022-01-05 MED ORDER — ONDANSETRON HCL 4 MG PO TABS: 4.0000 mg | ORAL_TABLET | Freq: Four times a day (QID) | ORAL | 0 refills | Status: DC

## 2022-01-05 MED ORDER — ONDANSETRON 4 MG PO TBDP
ORAL_TABLET | ORAL | Status: AC
  Filled 2022-01-05: qty 1

## 2022-01-05 NOTE — Discharge Instructions (Addendum)
You likely have food poisoning from something ingested at the deli.  The most important thing to do over the next few days is drink lots and lots of fluids.  You can take the Zofran every 6 hours as needed to help settle the stomach.  Your symptoms may last 24 to 48 hours.  Please go to the emergency department if symptoms worsen or do not improve after 2 to 3 days.

## 2022-01-05 NOTE — ED Triage Notes (Signed)
Pt reports after eating out at [employer] last night had n/v/d.

## 2022-01-05 NOTE — ED Provider Notes (Signed)
[facility]    CSN: [id number] Arrival date & time: 01/05/22  1813      History   Chief Complaint Chief Complaint  Patient presents with   Diarrhea    Stomach pains - Entered by patient   appt 7   Emesis    HPI Haley Cunningham is a 41 y.o. female.  Presents with nausea, vomiting, diarrhea and abdominal cramping.  Began last night a couple hours after eating at [employer].  Reports multiple episodes of diarrhea and vomiting overnight.  Last diarrhea episode was about 2 hours before presentation to urgent care. She has been able to tolerate fluids by mouth but vomits if she tries to eat something. The person she ate with also developed the same symptoms.  Past Medical History:  Diagnosis Date   Diabetes mellitus without complication (HCC)     There are no problems to display for this patient.   History reviewed. No pertinent surgical history.  OB History     Gravida      Para      Term      Preterm      AB      Living  1      SAB      IAB      Ectopic      Multiple      Live Births  1            Home Medications    Prior to Admission medications   Medication Sig Start Date End Date Taking? Authorizing Provider  ondansetron (ZOFRAN) 4 MG tablet Take 1 tablet (4 mg total) by mouth every 6 (six) hours. 01/05/22  Yes Delores Thelen, PA-C  Barberry-Oreg Grape-Goldenseal (BERBERINE COMPLEX PO) Take by mouth.    [provider]  calcium-vitamin D (OSCAL WITH D) 500-200 MG-UNIT tablet Take 1 tablet by mouth.    [provider]  lidocaine (LIDODERM) 5 % Place 1 patch onto the skin daily. Remove & Discard patch within 12 hours or as directed by MD 03/09/21   Nicanor Alcon, April, MD  [facility] 2.5 MG/0.5ML Pen Inject into the skin. 03/03/21   [provider]  ferrous fumarate (HEMOCYTE - 106 MG FE) 325 (106 FE) MG TABS tablet Take 1 tablet by mouth.  11/05/19  [provider]  pantoprazole (PROTONIX) 20 MG  tablet Take 1 tablet (20 mg total) by mouth 2 (two) times daily for 30 days. 09/05/18 11/05/19  Lamptey, Britta Mccreedy, MD    Family History Family History  Problem Relation Age of Onset   Healthy Mother    Healthy Father    Diabetes Maternal Grandmother    Diabetes Maternal Grandfather     Social History Social History   Tobacco Use   Smoking status: Never   Smokeless tobacco: Never  Vaping Use   Vaping Use: Never used  Substance Use Topics   Alcohol use: Yes    Comment: occasionally   Drug use: No     Allergies   Patient has no known allergies.   Review of Systems Review of Systems  Gastrointestinal:  Positive for diarrhea and vomiting.   Per HPI  Physical Exam Triage Vital Signs ED Triage Vitals  Enc Vitals Group     BP 01/05/22 1902 113/77     Pulse Rate 01/05/22 1902 (!) 107     Resp 01/05/22 1902 17     Temp 01/05/22 1902 98.3 F (36.8 C)     Temp  Source 01/05/22 1902 Oral     SpO2 01/05/22 1902 98 %     Weight --      Height --      Head Circumference --      Peak Flow --      Pain Score 01/05/22 1901 0     Pain Loc --      Pain Edu? --      Excl. in [place]? --    No data found.  Updated Vital Signs BP 113/77 (BP Location: Right Arm)   Pulse (!) 107   Temp 98.3 F (36.8 C) (Oral)   Resp 17   LMP 12/31/2021   SpO2 98%   Physical Exam Vitals and nursing note reviewed.  Constitutional:      Appearance: Normal appearance. She is not ill-appearing.  HENT:     Mouth/Throat:     Mouth: Mucous membranes are moist.     Pharynx: Oropharynx is clear.  Eyes:     Conjunctiva/sclera: Conjunctivae normal.  Cardiovascular:     Rate and Rhythm: Regular rhythm. Tachycardia present.     Heart sounds: Normal heart sounds.     Comments: Mildly tachycardic Pulmonary:     Effort: Pulmonary effort is normal. No respiratory distress.     Breath sounds: Normal breath sounds.  Abdominal:     Tenderness: There is abdominal tenderness. There is no guarding or  rebound.     Comments: Generalized tenderness  Skin:    General: Skin is warm and dry.  Neurological:     Mental Status: She is alert and oriented to person, place, and time.      UC Treatments / Results  Labs (all labs ordered are listed, but only abnormal results are displayed) Labs Reviewed - No data to display  EKG   Radiology No results found.  Procedures Procedures (including critical care time)  Medications Ordered in UC Medications  ondansetron (ZOFRAN-ODT) disintegrating tablet 4 mg (4 mg Oral Given 01/05/22 1920)    Initial Impression / Assessment and Plan / UC Course  I have reviewed the triage vital signs and the nursing notes.  Pertinent labs & imaging results that were available during my care of the patient were reviewed by me and considered in my medical decision making (see chart for details).  Zofran dose given with improvement in nausea/cramping. She was able to tolerate p.o fluids in clinic.  Likely food poisoning from something ingested at deli.  Discussed increasing fluids over the next few days, using Zofran as needed to settle the stomach - sent to pharmacy.  Bland diet as tolerated.  Strict urgent care and ED return precautions.  Patient agrees to plan  Final Clinical Impressions(s) / UC Diagnoses   Final diagnoses:  Gastroenteritis  Food poisoning     Discharge Instructions      You likely have food poisoning from something ingested at the deli.  The most important thing to do over the next few days is drink lots and lots of fluids.  You can take the Zofran every 6 hours as needed to help settle the stomach.  Your symptoms may last 24 to 48 hours.  Please go to the emergency department if symptoms worsen or do not improve after 2 to 3 days.     ED Prescriptions     Medication Sig Dispense Auth. Provider   ondansetron (ZOFRAN) 4 MG tablet Take 1 tablet (4 mg total) by mouth every 6 (six) hours. 12 tablet Nikola Blackston, Wells Guiles, PA-C  PDMP not reviewed this encounter.   Cloyde Oregel, Ray Church 01/05/22 2007

## 2022-01-09 ENCOUNTER — Other Ambulatory Visit: Payer: Self-pay

## 2022-01-09 ENCOUNTER — Ambulatory Visit (HOSPITAL_COMMUNITY)
Admission: RE | Admit: 2022-01-09 | Discharge: 2022-01-09 | Disposition: A | Discharge status: Home / Self Care | Payer: BC Managed Care – PPO | Source: Ambulatory Visit | Attending: Emergency Medicine | Admitting: Emergency Medicine

## 2022-01-09 ENCOUNTER — Encounter (HOSPITAL_COMMUNITY): Payer: Self-pay

## 2022-01-09 VITALS — BP 112/78 | HR 86 | Temp 98.5°F | Resp 19

## 2022-01-09 DIAGNOSIS — K529 Noninfective gastroenteritis and colitis, unspecified: Secondary | ICD-10-CM | POA: Diagnosis not present

## 2022-01-09 MED ORDER — PROMETHAZINE HCL 12.5 MG PO TABS: 12.5000 mg | ORAL_TABLET | Freq: Four times a day (QID) | ORAL | 0 refills | Status: DC | PRN

## 2022-01-09 MED ORDER — AZITHROMYCIN 500 MG PO TABS: 500.0000 mg | ORAL_TABLET | Freq: Every day | ORAL | 0 refills | Status: AC

## 2022-01-09 MED ORDER — LOPERAMIDE HCL 2 MG PO CAPS: 2.0000 mg | ORAL_CAPSULE | Freq: Four times a day (QID) | ORAL | 0 refills | Status: DC | PRN

## 2022-01-09 NOTE — Discharge Instructions (Addendum)
As your symptoms are continuing without minimal improvement we will provide coverage for bacteria  Take azithromycin every morning for 3 days  May attempt use of Phenergan every 8 hours to help calm nausea  You can use Imodium to help with diarrhea, and be mindful over use of this medication may cause opposite effect constipation  You can use over-the-counter ibuprofen or Tylenol, which ever you have at home, to help manage fevers  Continue to promote hydration throughout the day by using electrolyte replacement solution such as Gatorade, body armor, Pedialyte, which ever you have at home  Try eating bland foods such as bread, rice, toast, fruit which are easier on the stomach to digest, avoid foods that are overly spicy, overly seasoned or greasy

## 2022-01-09 NOTE — ED Provider Notes (Signed)
[facility]    CSN: [id number] Arrival date & time: 01/09/22  1016      History   Chief Complaint Chief Complaint  Patient presents with   Diarrhea    Revisit - Entered by patient    HPI Haley Cunningham is a 41 y.o. female.   Patient presents with nausea and persistent diarrhea for 6 days.  Symptoms started approximately 2 to 3 hours after consuming [employer], friend who ate similar meal had same symptoms.  Was evaluated in urgent care today after prescribed Zofran which patient has deemed this helpful.  Vomiting has subsided.  Last occurrence of diarrhea this morning, endorses that she had an episode every 1-2 hours last night.  Has had difficulty tolerating foods but has been able to tolerate fluids.  Denies fevers.  History of diabetes.    Past Medical History:  Diagnosis Date   Diabetes mellitus without complication (HCC)     There are no problems to display for this patient.   History reviewed. No pertinent surgical history.  OB History     Gravida      Para      Term      Preterm      AB      Living  1      SAB      IAB      Ectopic      Multiple      Live Births  1            Home Medications    Prior to Admission medications   Medication Sig Start Date End Date Taking? Authorizing Provider  Barberry-Oreg Grape-Goldenseal (BERBERINE COMPLEX PO) Take by mouth.    [provider]  calcium-vitamin D (OSCAL WITH D) 500-200 MG-UNIT tablet Take 1 tablet by mouth.    [provider]  lidocaine (LIDODERM) 5 % Place 1 patch onto the skin daily. Remove & Discard patch within 12 hours or as directed by MD 03/09/21   Nicanor Alcon, April, MD  [facility] 2.5 MG/0.5ML Pen Inject into the skin. 03/03/21   [provider]  ondansetron (ZOFRAN) 4 MG tablet Take 1 tablet (4 mg total) by mouth every 6 (six) hours. 01/05/22   Rising, Lurena Joiner, PA-C  ferrous fumarate (HEMOCYTE - 106 MG FE) 325 (106 FE) MG TABS tablet Take 1  tablet by mouth.  11/05/19  [provider]  pantoprazole (PROTONIX) 20 MG tablet Take 1 tablet (20 mg total) by mouth 2 (two) times daily for 30 days. 09/05/18 11/05/19  Lamptey, Britta Mccreedy, MD    Family History Family History  Problem Relation Age of Onset   Healthy Mother    Healthy Father    Diabetes Maternal Grandmother    Diabetes Maternal Grandfather     Social History Social History   Tobacco Use   Smoking status: Never   Smokeless tobacco: Never  Vaping Use   Vaping Use: Never used  Substance Use Topics   Alcohol use: Yes    Comment: occasionally   Drug use: No     Allergies   Patient has no known allergies.   Review of Systems Review of Systems  Constitutional: Negative.   Respiratory: Negative.    Cardiovascular: Negative.   Gastrointestinal:  Positive for diarrhea and nausea. Negative for abdominal distention, abdominal pain, anal bleeding, blood in stool, constipation, rectal pain and vomiting.  Skin: Negative.      Physical Exam Triage Vital Signs ED Triage Vitals  Enc Vitals Group     BP 01/09/22 1044 112/78     Pulse Rate 01/09/22 1044 86     Resp 01/09/22 1044 19     Temp 01/09/22 1044 98.5 F (36.9 C)     Temp src --      SpO2 01/09/22 1044 96 %     Weight --      Height --      Head Circumference --      Peak Flow --      Pain Score 01/09/22 1042 0     Pain Loc --      Pain Edu? --      Excl. in [place]? --    No data found.  Updated Vital Signs BP 112/78   Pulse 86   Temp 98.5 F (36.9 C)   Resp 19   LMP 12/31/2021   SpO2 96%   Visual Acuity Right Eye Distance:   Left Eye Distance:   Bilateral Distance:    Right Eye Near:   Left Eye Near:    Bilateral Near:     Physical Exam Constitutional:      Appearance: Normal appearance.  HENT:     Head: Normocephalic.  Eyes:     Extraocular Movements: Extraocular movements intact.  Pulmonary:     Effort: Pulmonary effort is normal.  Abdominal:     General: Abdomen is  flat. Bowel sounds are normal.     Palpations: Abdomen is soft.     Tenderness: There is abdominal tenderness in the right lower quadrant and left lower quadrant. There is no right CVA tenderness or guarding. Negative signs include Murphy's sign.  Skin:    General: Skin is warm and dry.  Neurological:     Mental Status: She is alert and oriented to person, place, and time. Mental status is at baseline.  Psychiatric:        Mood and Affect: Mood normal.        Behavior: Behavior normal.      UC Treatments / Results  Labs (all labs ordered are listed, but only abnormal results are displayed) Labs Reviewed - No data to display  EKG   Radiology No results found.  Procedures Procedures (including critical care time)  Medications Ordered in UC Medications - No data to display  Initial Impression / Assessment and Plan / UC Course  I have reviewed the triage vital signs and the nursing notes.  Pertinent labs & imaging results that were available during my care of the patient were reviewed by me and considered in my medical decision making (see chart for details).  Gastroenteritis  Patient is in no signs of distress nor toxic appearing, tenderness is noted to the lower abdominal region over the intestines, consistent if having diarrhea and vomiting for 6 days, discussed with patient, low suspicion for an acute abdomen, prescribed azithromycin, Phenergan and Imodium for outpatient use and recommend increase fluid intake until appetite has returned, recommended a bland diet then food as tolerated, given precautions to follow-up if symptoms continue to persist, note given Final Clinical Impressions(s) / UC Diagnoses   Final diagnoses:  Gastroenteritis     Discharge Instructions      As your symptoms are continuing without minimal improvement we will provide coverage for bacteria  Take azithromycin every morning for 3 days  May attempt use of Phenergan every 8 hours to help  calm nausea  You can use Imodium to help with diarrhea, and be mindful over  use of this medication may cause opposite effect constipation  You can use over-the-counter ibuprofen or Tylenol, which ever you have at home, to help manage fevers  Continue to promote hydration throughout the day by using electrolyte replacement solution such as Gatorade, body armor, Pedialyte, which ever you have at home  Try eating bland foods such as bread, rice, toast, fruit which are easier on the stomach to digest, avoid foods that are overly spicy, overly seasoned or greasy    ED Prescriptions   None    PDMP not reviewed this encounter.   Valinda Hoar, NP 01/09/22 1103

## 2022-01-09 NOTE — ED Triage Notes (Signed)
Pt reports she still has diarrhea . Pt was last seen on Friday for possibly eating bad [employer] meat. Pt reports vomiting has improved but still feels nausea.

## 2022-02-04 ENCOUNTER — Encounter (HOSPITAL_COMMUNITY): Payer: Self-pay

## 2022-02-04 ENCOUNTER — Ambulatory Visit (HOSPITAL_COMMUNITY): Payer: Self-pay

## 2022-02-04 ENCOUNTER — Ambulatory Visit (HOSPITAL_COMMUNITY)
Admission: EM | Admit: 2022-02-04 | Discharge: 2022-02-04 | Disposition: A | Discharge status: Home / Self Care | Payer: BC Managed Care – PPO | Attending: Physician Assistant | Admitting: Physician Assistant

## 2022-02-04 DIAGNOSIS — M62830 Muscle spasm of back: Secondary | ICD-10-CM

## 2022-02-04 MED ORDER — KETOROLAC TROMETHAMINE 30 MG/ML IJ SOLN: 30.0000 mg | Freq: Once | INTRAMUSCULAR | Status: DC

## 2022-02-04 MED ORDER — KETOROLAC TROMETHAMINE 30 MG/ML IJ SOLN
30.0000 mg | Freq: Once | INTRAMUSCULAR | Status: AC
  Administered 2022-02-04: 30 mg via INTRAMUSCULAR

## 2022-02-04 MED ORDER — KETOROLAC TROMETHAMINE 30 MG/ML IJ SOLN
INTRAMUSCULAR | Status: AC
  Filled 2022-02-04: qty 1

## 2022-02-04 MED ORDER — CYCLOBENZAPRINE HCL 5 MG PO TABS: 10.0000 mg | ORAL_TABLET | Freq: Three times a day (TID) | ORAL | 0 refills | Status: AC | PRN

## 2022-02-04 NOTE — ED Triage Notes (Signed)
Pt states lower back pain for the past 2 days. States she thinks she may have lifted something wrong. States she has not tried anything at home for the pain.

## 2022-02-04 NOTE — Discharge Instructions (Signed)
Take Flexeril as needed for muscle spasm Take Tylenol or Ibuprofen as needed for pain Recommend gentle stretching and ice to affected area as needed Return if you develop new or worsening symptoms.

## 2022-02-04 NOTE — ED Provider Notes (Signed)
[facility]    CSN: [id number] Arrival date & time: 02/04/22  1003      History   Chief Complaint Chief Complaint  Patient presents with   Back Pain    HPI Haley Cunningham is a 41 y.o. female.   Pt complains of lower back pain that started about two days ago.  Denies injury or trauma, but reports her work does require heavy lifting.  She denies radiation of pain, numbness, tingling, saddle anesthesia.  She has taken nothing for the sx.  She reports pain is worse with movement.     Past Medical History:  Diagnosis Date   Diabetes mellitus without complication ([place])     There are no problems to display for this patient.   History reviewed. No pertinent surgical history.  OB History     Gravida      Para      Term      Preterm      AB      Living  1      SAB      IAB      Ectopic      Multiple      Live Births  1            Home Medications    Prior to Admission medications   Medication Sig Start Date End Date Taking? Authorizing Provider  Barberry-Oreg Grape-Goldenseal (BERBERINE COMPLEX PO) Take by mouth.    [provider]  calcium-vitamin D (OSCAL WITH D) 500-200 MG-UNIT tablet Take 1 tablet by mouth.    [provider]  lidocaine (LIDODERM) 5 % Place 1 patch onto the skin daily. Remove & Discard patch within 12 hours or as directed by MD 03/09/21   Randal Buba, April, MD  loperamide (IMODIUM) 2 MG capsule Take 1 capsule (2 mg total) by mouth 4 (four) times daily as needed for diarrhea or loose stools. 01/09/22   White, Leitha Schuller, NP  [facility] 2.5 MG/0.5ML Pen Inject into the skin. 03/03/21   [provider]  ondansetron (ZOFRAN) 4 MG tablet Take 1 tablet (4 mg total) by mouth every 6 (six) hours. 01/05/22   Rising, Wells Guiles, PA-C  promethazine (PHENERGAN) 12.5 MG tablet Take 1 tablet (12.5 mg total) by mouth every 6 (six) hours as needed for nausea or vomiting. 01/09/22   Hans Eden, NP  ferrous  fumarate (HEMOCYTE - 106 MG FE) 325 (106 FE) MG TABS tablet Take 1 tablet by mouth.  11/05/19  [provider]  pantoprazole (PROTONIX) 20 MG tablet Take 1 tablet (20 mg total) by mouth 2 (two) times daily for 30 days. 09/05/18 11/05/19  Lamptey, Myrene Galas, MD    Family History Family History  Problem Relation Age of Onset   Healthy Mother    Healthy Father    Diabetes Maternal Grandmother    Diabetes Maternal Grandfather     Social History Social History   Tobacco Use   Smoking status: Never   Smokeless tobacco: Never  Vaping Use   Vaping Use: Never used  Substance Use Topics   Alcohol use: Yes    Comment: occasionally   Drug use: No     Allergies   Patient has no known allergies.   Review of Systems Review of Systems  Constitutional:  Negative for chills and fever.  HENT:  Negative for ear pain and sore throat.   Eyes:  Negative for pain and visual disturbance.  Respiratory:  Negative for  cough and shortness of breath.   Cardiovascular:  Negative for chest pain and palpitations.  Gastrointestinal:  Negative for abdominal pain and vomiting.  Genitourinary:  Negative for dysuria and hematuria.  Musculoskeletal:  Positive for back pain. Negative for arthralgias.  Skin:  Negative for color change and rash.  Neurological:  Negative for seizures and syncope.  All other systems reviewed and are negative.    Physical Exam Triage Vital Signs ED Triage Vitals [02/04/22 1021]  Enc Vitals Group     BP 113/71     Pulse Rate 85     Resp 16     Temp 97.8 F (36.6 C)     Temp Source Oral     SpO2 100 %     Weight      Height      Head Circumference      Peak Flow      Pain Score 8     Pain Loc      Pain Edu?      Excl. in [place]?    No data found.  Updated Vital Signs BP 113/71 (BP Location: Right Arm)   Pulse 85   Temp 97.8 F (36.6 C) (Oral)   Resp 16   LMP 02/04/2022 (Exact Date)   SpO2 100%   Visual Acuity Right Eye Distance:   Left Eye Distance:    Bilateral Distance:    Right Eye Near:   Left Eye Near:    Bilateral Near:     Physical Exam Vitals and nursing note reviewed.  Constitutional:      General: She is not in acute distress.    Appearance: She is well-developed.  HENT:     Head: Normocephalic and atraumatic.  Eyes:     Conjunctiva/sclera: Conjunctivae normal.  Cardiovascular:     Rate and Rhythm: Normal rate and regular rhythm.     Heart sounds: No murmur heard. Pulmonary:     Effort: Pulmonary effort is normal. No respiratory distress.     Breath sounds: Normal breath sounds.  Abdominal:     Palpations: Abdomen is soft.     Tenderness: There is no abdominal tenderness.  Musculoskeletal:        General: No swelling.     Cervical back: Neck supple.     Comments: Lumbar paraspinal muscular spasm with TTP.  Negative SLR  Skin:    General: Skin is warm and dry.     Capillary Refill: Capillary refill takes less than 2 seconds.  Neurological:     Mental Status: She is alert.  Psychiatric:        Mood and Affect: Mood normal.      UC Treatments / Results  Labs (all labs ordered are listed, but only abnormal results are displayed) Labs Reviewed - No data to display  EKG   Radiology No results found.  Procedures Procedures (including critical care time)  Medications Ordered in UC Medications  ketorolac (TORADOL) 30 MG/ML injection 30 mg (has no administration in time range)    Initial Impression / Assessment and Plan / UC Course  I have reviewed the triage vital signs and the nursing notes.  Pertinent labs & imaging results that were available during my care of the patient were reviewed by me and considered in my medical decision making (see chart for details).     Lumbar muscular spasm, no radicular sx.  Supportive care discussed.  Toradol given in clinic today.  Flexeril prescribed to take as needed for  muscle spasm.  Return precautions discussed.  Final Clinical Impressions(s) / UC Diagnoses    Final diagnoses:  Muscle spasm of back     Discharge Instructions      Take Flexeril as needed for muscle spasm Take Tylenol or Ibuprofen as needed for pain Recommend gentle stretching and ice to affected area as needed Return if you develop new or worsening symptoms.     ED Prescriptions   None    PDMP not reviewed this encounter.   Ward, Tylene Fantasia, PA-C 02/04/22 1040

## 2022-04-10 ENCOUNTER — Emergency Department (HOSPITAL_BASED_OUTPATIENT_CLINIC_OR_DEPARTMENT_OTHER)
Admission: EM | Admit: 2022-04-10 | Discharge: 2022-04-10 | Disposition: A | Discharge status: Home / Self Care | Payer: BC Managed Care – PPO | Attending: Emergency Medicine | Admitting: Emergency Medicine

## 2022-04-10 ENCOUNTER — Other Ambulatory Visit: Payer: Self-pay

## 2022-04-10 ENCOUNTER — Emergency Department (HOSPITAL_BASED_OUTPATIENT_CLINIC_OR_DEPARTMENT_OTHER): Payer: BC Managed Care – PPO

## 2022-04-10 ENCOUNTER — Encounter (HOSPITAL_BASED_OUTPATIENT_CLINIC_OR_DEPARTMENT_OTHER): Payer: Self-pay | Admitting: Emergency Medicine

## 2022-04-10 DIAGNOSIS — R1084 Generalized abdominal pain: Secondary | ICD-10-CM | POA: Diagnosis not present

## 2022-04-10 DIAGNOSIS — Z1152 Encounter for screening for COVID-19: Secondary | ICD-10-CM | POA: Diagnosis not present

## 2022-04-10 LAB — CBC
HCT: 40.1 % (ref 36.0–46.0)
Hemoglobin: 12.4 g/dL (ref 12.0–15.0)
MCH: 24.9 pg — ABNORMAL LOW (ref 26.0–34.0)
MCHC: 30.9 g/dL (ref 30.0–36.0)
MCV: 80.7 fL (ref 80.0–100.0)
Platelets: 468 10*3/uL — ABNORMAL HIGH (ref 150–400)
RBC: 4.97 MIL/uL (ref 3.87–5.11)
RDW: 15.7 % — ABNORMAL HIGH (ref 11.5–15.5)
WBC: 12.8 10*3/uL — ABNORMAL HIGH (ref 4.0–10.5)
nRBC: 0 % (ref 0.0–0.2)

## 2022-04-10 LAB — LIPASE, BLOOD: Lipase: 30 U/L (ref 11–51)

## 2022-04-10 LAB — BASIC METABOLIC PANEL
Anion gap: 11 (ref 5–15)
BUN: 12 mg/dL (ref 6–20)
CO2: 25 mmol/L (ref 22–32)
Calcium: 9.8 mg/dL (ref 8.9–10.3)
Chloride: 100 mmol/L (ref 98–111)
Creatinine, Ser: 0.66 mg/dL (ref 0.44–1.00)
GFR, Estimated: >60 mL/min (ref >60)
Glucose, Bld: 90 mg/dL (ref 70–99)
Potassium: 3.7 mmol/L (ref 3.5–5.1)
Sodium: 136 mmol/L (ref 135–145)

## 2022-04-10 LAB — URINALYSIS, ROUTINE W REFLEX MICROSCOPIC
Bilirubin Urine: NEGATIVE
Glucose, UA: NEGATIVE mg/dL
Hgb urine dipstick: NEGATIVE
Ketones, ur: 15 mg/dL — AB
Nitrite: NEGATIVE
Specific Gravity, Urine: 1.031 — ABNORMAL HIGH (ref 1.005–1.030)
pH: 6.5 (ref 5.0–8.0)

## 2022-04-10 LAB — RESP PANEL BY RT-PCR (RSV, FLU A&B, COVID)  RVPGX2
Influenza A by PCR: NEGATIVE
Influenza B by PCR: NEGATIVE
Resp Syncytial Virus by PCR: NEGATIVE
SARS Coronavirus 2 by RT PCR: NEGATIVE

## 2022-04-10 LAB — PREGNANCY, URINE: Preg Test, Ur: NEGATIVE

## 2022-04-10 LAB — CBG MONITORING, ED: Glucose-Capillary: 88 mg/dL (ref 70–99)

## 2022-04-10 MED ORDER — MORPHINE SULFATE (PF) 4 MG/ML IV SOLN
4.0000 mg | Freq: Once | INTRAVENOUS | Status: AC
  Administered 2022-04-10: 4 mg via INTRAVENOUS
  Filled 2022-04-10: qty 1

## 2022-04-10 MED ORDER — LACTATED RINGERS IV BOLUS
1000.0000 mL | Freq: Once | INTRAVENOUS | Status: AC
  Administered 2022-04-10: 1000 mL via INTRAVENOUS

## 2022-04-10 MED ORDER — IOHEXOL 300 MG/ML  SOLN
100.0000 mL | Freq: Once | INTRAMUSCULAR | Status: AC | PRN
  Administered 2022-04-10: 85 mL via INTRAVENOUS

## 2022-04-10 MED ORDER — ONDANSETRON HCL 4 MG/2ML IJ SOLN
4.0000 mg | Freq: Once | INTRAMUSCULAR | Status: AC
  Administered 2022-04-10: 4 mg via INTRAVENOUS
  Filled 2022-04-10: qty 2

## 2022-04-10 MED ORDER — KETOROLAC TROMETHAMINE 15 MG/ML IJ SOLN
15.0000 mg | Freq: Once | INTRAMUSCULAR | Status: AC
  Administered 2022-04-10: 15 mg via INTRAVENOUS
  Filled 2022-04-10: qty 1

## 2022-04-10 MED ORDER — ONDANSETRON HCL 4 MG PO TABS: 4.0000 mg | ORAL_TABLET | Freq: Four times a day (QID) | ORAL | 0 refills | Status: DC

## 2022-04-10 NOTE — Discharge Instructions (Signed)
You were seen today for abdominal pain.  Your evaluation has no acute pathology though you likely have a nonspecific enteritis. I do recommend you follow-up with your primary care provider with this and you may use nausea medicine for symptoms as needed. Additionally, your CT scan did demonstrate a sclerotic lesion on your right hip bone.  This is likely benign but your primary care provider should follow this up to ensure it is not a more significant lesion.  You may require an MRI in the outpatient setting. You should follow-up with your primary care provider within 72 hours regarding both of these conditions.

## 2022-04-10 NOTE — ED Provider Notes (Signed)
[facility] EMERGENCY DEPT Provider Note   CSN: [id number] Arrival date & time: 04/10/22  1737     History Chief Complaint  Patient presents with   Weakness    HPI Davene R Agostini is a 41 y.o. female presenting for abdominal pain and generalized malaise.  She is a 41 year old female with a history of diabetes.  She states that over the last 3 days she has had progressive abdominal pain in her right lower quadrant radiating to her right side.  She thinks it may be related to her injectable antidiabetic medications but is uncertain.  She is ambulatory though she has some severe nausea.  She is able to tolerate small amounts of p.o. intake.  Endorses subtle fevers..   Patient's recorded medical, surgical, social, medication list and allergies were reviewed in the Snapshot window as part of the initial history.   Review of Systems   Review of Systems  Constitutional:  Negative for chills and fever.  HENT:  Negative for ear pain and sore throat.   Eyes:  Negative for pain and visual disturbance.  Respiratory:  Negative for cough and shortness of breath.   Cardiovascular:  Negative for chest pain and palpitations.  Gastrointestinal:  Positive for abdominal pain. Negative for nausea and vomiting.  Genitourinary:  Negative for dysuria and hematuria.  Musculoskeletal:  Negative for arthralgias and back pain.  Skin:  Negative for color change and rash.  Neurological:  Negative for seizures and syncope.  All other systems reviewed and are negative.   Physical Exam Updated Vital Signs BP 121/70   Pulse 89   Temp 98.6 F (37 C)   Resp 18   Ht 5\' 8"  (1.727 m)   Wt 104.8 kg   LMP 04/03/2022 (Approximate)   SpO2 98%   BMI 35.12 kg/m  Physical Exam Vitals and nursing note reviewed.  Constitutional:      General: She is not in acute distress.    Appearance: She is well-developed.  HENT:     Head: Normocephalic and atraumatic.  Eyes:     Conjunctiva/sclera:  Conjunctivae normal.  Cardiovascular:     Rate and Rhythm: Normal rate and regular rhythm.     Heart sounds: No murmur heard. Pulmonary:     Effort: Pulmonary effort is normal. No respiratory distress.     Breath sounds: Normal breath sounds.  Abdominal:     General: There is no distension.     Palpations: Abdomen is soft.     Tenderness: There is abdominal tenderness. There is no right CVA tenderness, left CVA tenderness or guarding.  Musculoskeletal:        General: No swelling.     Cervical back: Neck supple.  Skin:    General: Skin is warm and dry.     Capillary Refill: Capillary refill takes less than 2 seconds.  Neurological:     Mental Status: She is alert.  Psychiatric:        Mood and Affect: Mood normal.      ED Course/ Medical Decision Making/ A&P    Procedures Procedures   Medications Ordered in ED Medications  lactated ringers bolus 1,000 mL (0 mLs Intravenous Stopped 04/10/22 2213)  ondansetron (ZOFRAN) injection 4 mg (4 mg Intravenous Given 04/10/22 1951)  morphine (PF) 4 MG/ML injection 4 mg (4 mg Intravenous Given 04/10/22 1952)  iohexol (OMNIPAQUE) 300 MG/ML solution 100 mL (85 mLs Intravenous Contrast Given 04/10/22 2027)  ketorolac (TORADOL) 15 MG/ML injection 15 mg (15 mg Intravenous  Given 04/10/22 2220)   Medical Decision Making:   Brynja R Woodbeck is a 41 y.o. female who presented to the ED today with abdominal pain, detailed above.    Patient's presentation is complicated by their history of DM.  Patient placed on continuous vitals and telemetry monitoring while in ED which was reviewed periodically.  Complete initial physical exam performed, notably the patient  was HDS in NAD tender RLQ.     Reviewed and confirmed nursing documentation for past medical history, family history, social history.    Initial Assessment:   With the patient's presentation of abdominal pain, most likely diagnosis is nonspecific etiology. Other diagnoses were  considered including (but not limited to) gastroenteritis, colitis, small bowel obstruction, appendicitis, cholecystitis, pancreatitis, nephrolithiasis, UTI, pyleonephritis, ruptured ectopic pregnancy, PID, ovarian/testicular torsion. These are considered less likely due to history of present illness and physical exam findings.   This is most consistent with an acute life/limb threatening illness complicated by underlying chronic conditions.   Initial Plan:  CBC/CMP to evaluate for underlying infectious/metabolic etiology for patient's abdominal pain  Lipase to evaluate for pancreatitis  CTAB/Pelvis with contrast to evaluate for structural/surgical etiology of patients' severe abdominal pain.  Urinalysis and repeat physical assessment to evaluate for UTI/Pyelonpehritis  Empiric management of symptoms with escalating pain control and antiemetics as needed.   Initial Study Results:   Laboratory  All laboratory results reviewed without evidence of clinically relevant pathology.   Exceptions include: wbc elevation     Radiology All images reviewed independently. Agree with radiology report at this time.   CT ABDOMEN PELVIS W CONTRAST  Result Date: 04/10/2022 CLINICAL DATA:  Weakness for 2 days, lower abdominal pain with nausea EXAM: CT ABDOMEN AND PELVIS WITH CONTRAST TECHNIQUE: Multidetector CT imaging of the abdomen and pelvis was performed using the standard protocol following bolus administration of intravenous contrast. RADIATION DOSE REDUCTION: This exam was performed according to the departmental dose-optimization program which includes automated exposure control, adjustment of the mA and/or kV according to patient size and/or use of iterative reconstruction technique. CONTRAST:  68mL OMNIPAQUE IOHEXOL 300 MG/ML  SOLN COMPARISON:  None Available. FINDINGS: Lower chest: Mild cardiomegaly. Hepatobiliary: Heterogeneous density in the gallbladder could be from sludge or gallstones. No biliary  dilatation. The liver appears otherwise normal. Pancreas: Pancreas divisum. Spleen: Unremarkable Adrenals/Urinary Tract: Unremarkable Stomach/Bowel: The stomach is mildly distended with fluid. No dilated bowel. Air fluid levels in the descending and sigmoid colon may indicate diarrheal process. Appendiceal diameter 7 mm, upper normal, without periappendiceal inflammatory findings. Vascular/Lymphatic: Unremarkable Reproductive: Unremarkable Other: No supplemental non-categorized findings. Musculoskeletal: Intramedullary rim sclerotic lucent lesion of the left femoral neck measuring 3.4 by 2.6 by 2.0 cm, small amount of faint internal calcification centrally on image 49 series 5. No cortical extension or periosteal reaction. IMPRESSION: 1. Air fluid levels in the descending and sigmoid colon may indicate diarrheal process. 2. 3.4 cm rim sclerotic lucent lesion in the left femoral neck with a faint internal central calcification. No other bony lesions are identified and there is no cortical extension or periosteal reaction. Differential diagnostic considerations may include fibrous dysplasia, chronic infection, unusual enchondroma, or indolent malignancy such as metastatic disease or myeloma. The sharply defined rim sclerotic nature of the lesion argues against a rapidly progressive process. MRI of the may be helpful in further characterization of this lesion. 3. Mild cardiomegaly. 4. Heterogeneous density in the gallbladder could be from sludge or gallstones. 5. Pancreas divisum. Electronically Signed   By: Zollie Beckers  Ova Freshwater M.D.   On: 04/10/2022 21:03      Final Reassessment and Plan:   Reevaluated patient after 4-1/2 hours in the emergency department.  No acute pathology appreciated.  She does have abnormality on her CT scan including sclerotic lesion.  This will need to be evaluated by her PCP in the outpatient setting for further care and management. Unlikely be the source of her diarrheal infection and  abdominal pain bring her in today.  Likely nonspecific enteritis.  I discussed the clinical overlap between this condition and appendicitis.  Patient will plan to return if her symptoms or not improving over the next 24 hours for reassessment repeat labs or further therapies as needed.  Disposition:  I have considered need for hospitalization, however, considering all of the above, I believe this patient is stable for discharge at this time.  Patient/family educated about specific return precautions for given chief complaint and symptoms.  Patient/family educated about follow-up with PCP.     Patient/family expressed understanding of return precautions and need for follow-up. Patient spoken to regarding all imaging and laboratory results and appropriate follow up for these results. All education provided in verbal form with additional information in written form. Time was allowed for answering of patient questions. Patient discharged.    Emergency Department Medication Summary:   Medications  lactated ringers bolus 1,000 mL (0 mLs Intravenous Stopped 04/10/22 2213)  ondansetron (ZOFRAN) injection 4 mg (4 mg Intravenous Given 04/10/22 1951)  morphine (PF) 4 MG/ML injection 4 mg (4 mg Intravenous Given 04/10/22 1952)  iohexol (OMNIPAQUE) 300 MG/ML solution 100 mL (85 mLs Intravenous Contrast Given 04/10/22 2027)  ketorolac (TORADOL) 15 MG/ML injection 15 mg (15 mg Intravenous Given 04/10/22 2220)            Clinical Impression:  1. Generalized abdominal pain      Discharge   Final Clinical Impression(s) / ED Diagnoses Final diagnoses:  Generalized abdominal pain    Rx / DC Orders ED Discharge Orders          Ordered    ondansetron (ZOFRAN) 4 MG tablet  Every 6 hours        04/10/22 2216              Glyn Ade, MD 04/10/22 2322

## 2022-04-10 NOTE — ED Triage Notes (Signed)
Pt from home with weakness x 2 days after taking her weekly mounjaro injection. Pt also reports lower abdominal pain. Endorses nausea, denies emesis; endorses "a little diarrhea." Pt alert & oriented, nad noted.

## 2022-04-13 ENCOUNTER — Encounter (HOSPITAL_BASED_OUTPATIENT_CLINIC_OR_DEPARTMENT_OTHER): Payer: Self-pay | Admitting: Emergency Medicine

## 2022-04-13 ENCOUNTER — Ambulatory Visit
Admission: RE | Admit: 2022-04-13 | Discharge: 2022-04-13 | Disposition: A | Discharge status: Home / Self Care | Payer: BC Managed Care – PPO | Source: Ambulatory Visit

## 2022-04-13 ENCOUNTER — Other Ambulatory Visit: Payer: Self-pay

## 2022-04-13 VITALS — BP 123/73 | HR 118 | Temp 98.3°F | Resp 18

## 2022-04-13 DIAGNOSIS — R1030 Lower abdominal pain, unspecified: Secondary | ICD-10-CM | POA: Diagnosis not present

## 2022-04-13 DIAGNOSIS — R000 Tachycardia, unspecified: Secondary | ICD-10-CM

## 2022-04-13 DIAGNOSIS — E860 Dehydration: Secondary | ICD-10-CM | POA: Diagnosis not present

## 2022-04-13 DIAGNOSIS — K529 Noninfective gastroenteritis and colitis, unspecified: Secondary | ICD-10-CM | POA: Insufficient documentation

## 2022-04-13 DIAGNOSIS — R197 Diarrhea, unspecified: Secondary | ICD-10-CM

## 2022-04-13 DIAGNOSIS — R112 Nausea with vomiting, unspecified: Secondary | ICD-10-CM

## 2022-04-13 DIAGNOSIS — R109 Unspecified abdominal pain: Secondary | ICD-10-CM | POA: Diagnosis present

## 2022-04-13 LAB — CBC
HCT: 40.6 % (ref 36.0–46.0)
Hemoglobin: 12.5 g/dL (ref 12.0–15.0)
MCH: 24.6 pg — ABNORMAL LOW (ref 26.0–34.0)
MCHC: 30.8 g/dL (ref 30.0–36.0)
MCV: 79.8 fL — ABNORMAL LOW (ref 80.0–100.0)
Platelets: 480 10*3/uL — ABNORMAL HIGH (ref 150–400)
RBC: 5.09 MIL/uL (ref 3.87–5.11)
RDW: 15.5 % (ref 11.5–15.5)
WBC: 11 10*3/uL — ABNORMAL HIGH (ref 4.0–10.5)
nRBC: 0 % (ref 0.0–0.2)

## 2022-04-13 LAB — COMPREHENSIVE METABOLIC PANEL
ALT: 13 U/L (ref 0–44)
AST: 14 U/L — ABNORMAL LOW (ref 15–41)
Albumin: 4.5 g/dL (ref 3.5–5.0)
Alkaline Phosphatase: 84 U/L (ref 38–126)
Anion gap: 11 (ref 5–15)
BUN: 9 mg/dL (ref 6–20)
CO2: 24 mmol/L (ref 22–32)
Calcium: 9.6 mg/dL (ref 8.9–10.3)
Chloride: 101 mmol/L (ref 98–111)
Creatinine, Ser: 0.71 mg/dL (ref 0.44–1.00)
GFR, Estimated: >60 mL/min (ref >60)
Glucose, Bld: 107 mg/dL — ABNORMAL HIGH (ref 70–99)
Potassium: 3.9 mmol/L (ref 3.5–5.1)
Sodium: 136 mmol/L (ref 135–145)
Total Bilirubin: 0.8 mg/dL (ref 0.3–1.2)
Total Protein: 9.2 g/dL — ABNORMAL HIGH (ref 6.5–8.1)

## 2022-04-13 LAB — LIPASE, BLOOD: Lipase: 30 U/L (ref 11–51)

## 2022-04-13 NOTE — ED Provider Notes (Signed)
[name]    CSN: [id number] Arrival date & time: 04/13/22  1622      History   Chief Complaint Chief Complaint  Patient presents with   Abdominal Pain    I went to the hospital and they gave me a CT scan and I have a stomach virus and I am taking medicine. My stomach is really hurting and I am not able to keep anything down. I am also weak and can't stand up for a long period of time. - Entered by patient   Diarrhea    HPI Haley Cunningham is a 41 y.o. female.   Patient presents with nausea, vomiting, diarrhea, abdominal pain that started about 5 days ago.  Patient originally presented to the ED on 04/10/2022 for evaluation.  She had full workup and was thought to have viral illness so was discharged with Zofran.  She reports that symptoms are not improving.  She reports that she has had generalized weakness as well.  Denies blood in stool or emesis.  Denies any associated fever.  Patient reports abdominal pain is rated 8/10 on pain scale and is located in the lower abdomen.  It is a constant, cramping pain per patient report.  She states that she has been able to keep minimal food and fluids down.   Abdominal Pain Diarrhea   Past Medical History:  Diagnosis Date   Diabetes mellitus without complication ([place])     There are no problems to display for this patient.   History reviewed. No pertinent surgical history.  OB History     Gravida      Para      Term      Preterm      AB      Living  1      SAB      IAB      Ectopic      Multiple      Live Births  1            Home Medications    Prior to Admission medications   Medication Sig Start Date End Date Taking? Authorizing Provider  Barberry-Oreg Grape-Goldenseal (BERBERINE COMPLEX PO) Take by mouth.    [provider]  calcium-vitamin D (OSCAL WITH D) 500-200 MG-UNIT tablet Take 1 tablet by mouth.    [provider]  lidocaine (LIDODERM) 5 % Place 1 patch  onto the skin daily. Remove & Discard patch within 12 hours or as directed by MD 03/09/21   Randal Buba, April, MD  loperamide (IMODIUM) 2 MG capsule Take 1 capsule (2 mg total) by mouth 4 (four) times daily as needed for diarrhea or loose stools. 01/09/22   White, Leitha Schuller, NP  MOUNJARO 2.5 MG/0.5ML Pen Inject into the skin. 03/03/21   [provider]  ondansetron (ZOFRAN) 4 MG tablet Take 1 tablet (4 mg total) by mouth every 6 (six) hours. 04/10/22   Tretha Sciara, MD  promethazine (PHENERGAN) 12.5 MG tablet Take 1 tablet (12.5 mg total) by mouth every 6 (six) hours as needed for nausea or vomiting. 01/09/22   Hans Eden, NP  ferrous fumarate (HEMOCYTE - 106 MG FE) 325 (106 FE) MG TABS tablet Take 1 tablet by mouth.  11/05/19  [provider]  pantoprazole (PROTONIX) 20 MG tablet Take 1 tablet (20 mg total) by mouth 2 (two) times daily for 30 days. 09/05/18 11/05/19  LampteyMyrene Galas, MD    Family History Family History  Problem Relation Age of Onset   Healthy Mother    Healthy Father    Diabetes Maternal Grandmother    Diabetes Maternal Grandfather     Social History Social History   Tobacco Use   Smoking status: Never   Smokeless tobacco: Never  Vaping Use   Vaping Use: Never used  Substance Use Topics   Alcohol use: Yes    Comment: occasionally   Drug use: No     Allergies   Patient has no known allergies.   Review of Systems Review of Systems Per HPI  Physical Exam Triage Vital Signs ED Triage Vitals  Enc Vitals Group     BP 04/13/22 1705 (!) 179/88     Pulse Rate 04/13/22 1706 (!) 118     Resp 04/13/22 1706 18     Temp 04/13/22 1706 98.3 F (36.8 C)     Temp src --      SpO2 04/13/22 1706 98 %     Weight --      Height --      Head Circumference --      Peak Flow --      Pain Score 04/13/22 1705 8     Pain Loc --      Pain Edu? --      Excl. in GC? --    No data found.  Updated Vital Signs BP 123/73   Pulse (!) 118   Temp  98.3 F (36.8 C)   Resp 18   LMP 04/03/2022 (Approximate)   SpO2 98%   Visual Acuity Right Eye Distance:   Left Eye Distance:   Bilateral Distance:    Right Eye Near:   Left Eye Near:    Bilateral Near:     Physical Exam Constitutional:      General: She is not in acute distress.    Appearance: Normal appearance. She is not toxic-appearing or diaphoretic.  HENT:     Head: Normocephalic and atraumatic.  Eyes:     Extraocular Movements: Extraocular movements intact.     Conjunctiva/sclera: Conjunctivae normal.  Cardiovascular:     Rate and Rhythm: Regular rhythm. Tachycardia present.     Pulses: Normal pulses.     Heart sounds: Normal heart sounds.  Pulmonary:     Effort: Pulmonary effort is normal. No respiratory distress.     Breath sounds: Normal breath sounds.  Abdominal:     General: Bowel sounds are normal. There is no distension.     Palpations: Abdomen is soft.     Tenderness: There is abdominal tenderness in the right lower quadrant, suprapubic area and left lower quadrant.     Comments: Significant tenderness to palpation across lower abdomen.  Neurological:     General: No focal deficit present.     Mental Status: She is alert and oriented to person, place, and time. Mental status is at baseline.  Psychiatric:        Mood and Affect: Mood normal.        Behavior: Behavior normal.        Thought Content: Thought content normal.        Judgment: Judgment normal.      UC Treatments / Results  Labs (all labs ordered are listed, but only abnormal results are displayed) Labs Reviewed - No data to display  EKG   Radiology No results found.  Procedures Procedures (including critical care time)  Medications Ordered in UC Medications - No data to display  Initial  Impression / Assessment and Plan / UC Course  I have reviewed the triage vital signs and the nursing notes.  Pertinent labs & imaging results that were available during my care of the  patient were reviewed by me and considered in my medical decision making (see chart for details).     Patient has persistent gastrointestinal symptoms with associated tachycardia and significant tenderness to palpation on abdominal exam.  Given this, there is concern for dehydration and need for further workup.  Patient was advised to go to the ER for further evaluation and management and was agreeable with plan.  Vital signs stable at discharge.  Agree with patient self transport to the hospital. Final Clinical Impressions(s) / UC Diagnoses   Final diagnoses:  Nausea vomiting and diarrhea  Lower abdominal pain  Tachycardia     Discharge Instructions      Go straight to the emergency department as soon as you leave urgent care for further evaluation and management.    ED Prescriptions   None    PDMP not reviewed this encounter.   Teodora Medici, [place] 04/13/22 [id number]

## 2022-04-13 NOTE — ED Triage Notes (Signed)
Pt presents to ED Pov. Pt c/o RLQ abd pain x5d. Pt reports she was here tues and feels the same. Seen at [facility] today and sent here for IVF d/t tachycardia.

## 2022-04-13 NOTE — ED Notes (Signed)
Patient is being discharged from the Urgent Care and sent to the Emergency Department via pov . Per [place] ,np , patient is in need of higher level of care due to symptoms  . Patient is aware and verbalizes understanding of plan of care.  Vitals:   04/13/22 1706  BP: 123/73  Pulse: (!) 118  Resp: 18  Temp: 98.3 F (36.8 C)  SpO2: 98%

## 2022-04-13 NOTE — ED Triage Notes (Signed)
Pt presents to uc with co of abd pain and nausea for 5 days, pt recently in hospital was sent home with zofran which helped some but they only gave her 3 pills. Pain is 8/10 llq and rlq

## 2022-04-13 NOTE — Discharge Instructions (Signed)
Go straight to the emergency department as soon as you leave urgent care for further evaluation and management. 

## 2022-04-14 ENCOUNTER — Emergency Department (HOSPITAL_BASED_OUTPATIENT_CLINIC_OR_DEPARTMENT_OTHER)
Admission: EM | Admit: 2022-04-14 | Discharge: 2022-04-14 | Disposition: A | Discharge status: Home / Self Care | Payer: BC Managed Care – PPO | Attending: Emergency Medicine | Admitting: Emergency Medicine

## 2022-04-14 DIAGNOSIS — K529 Noninfective gastroenteritis and colitis, unspecified: Secondary | ICD-10-CM | POA: Diagnosis not present

## 2022-04-14 DIAGNOSIS — E860 Dehydration: Secondary | ICD-10-CM

## 2022-04-14 MED ORDER — ONDANSETRON HCL 4 MG/2ML IJ SOLN
4.0000 mg | Freq: Once | INTRAMUSCULAR | Status: AC
  Administered 2022-04-14: 4 mg via INTRAVENOUS
  Filled 2022-04-14: qty 2

## 2022-04-14 MED ORDER — SODIUM CHLORIDE 0.9 % IV BOLUS
1000.0000 mL | Freq: Once | INTRAVENOUS | Status: AC
  Administered 2022-04-14: 1000 mL via INTRAVENOUS

## 2022-04-14 NOTE — ED Provider Notes (Signed)
[facility] EMERGENCY DEPT Provider Note   CSN: [id number] Arrival date & time: 04/13/22  1845     History  Chief Complaint  Patient presents with   Abdominal Pain    Haley Cunningham is a 41 y.o. female.  HPI     This is a 41 year old female who presents with ongoing nausea and abdominal pain.  She was seen and evaluated here on Tuesday.  She had a CT scan at that time which was largely reassuring but did show some evidence of likely gastroenteritis.  She has been taking Zofran at home which has been helping with her vomiting.  However, she reports ongoing diarrhea.  She reports that she generally feels weak and dehydrated.  She is having some bilateral mid abdominal discomfort.  Denies any urinary symptoms.  Denies fevers.  Denies chills.  Home Medications Prior to Admission medications   Medication Sig Start Date End Date Taking? Authorizing Provider  Barberry-Oreg Grape-Goldenseal (BERBERINE COMPLEX PO) Take by mouth.    [provider]  calcium-vitamin D (OSCAL WITH D) 500-200 MG-UNIT tablet Take 1 tablet by mouth.    [provider]  lidocaine (LIDODERM) 5 % Place 1 patch onto the skin daily. Remove & Discard patch within 12 hours or as directed by MD 03/09/21   Nicanor Alcon, April, MD  loperamide (IMODIUM) 2 MG capsule Take 1 capsule (2 mg total) by mouth 4 (four) times daily as needed for diarrhea or loose stools. 01/09/22   White, Elita Boone, NP  MOUNJARO 2.5 MG/0.5ML Pen Inject into the skin. 03/03/21   [provider]  ondansetron (ZOFRAN) 4 MG tablet Take 1 tablet (4 mg total) by mouth every 6 (six) hours. 04/10/22   Glyn Ade, MD  promethazine (PHENERGAN) 12.5 MG tablet Take 1 tablet (12.5 mg total) by mouth every 6 (six) hours as needed for nausea or vomiting. 01/09/22   Valinda Hoar, NP  ferrous fumarate (HEMOCYTE - 106 MG FE) 325 (106 FE) MG TABS tablet Take 1 tablet by mouth.  11/05/19  [provider]   pantoprazole (PROTONIX) 20 MG tablet Take 1 tablet (20 mg total) by mouth 2 (two) times daily for 30 days. 09/05/18 11/05/19  LampteyBritta Mccreedy, MD      Allergies    Patient has no known allergies.    Review of Systems   Review of Systems  Constitutional:  Negative for fever.  Respiratory:  Negative for shortness of breath.   Cardiovascular:  Negative for chest pain.  Gastrointestinal:  Positive for abdominal pain, diarrhea and nausea. Negative for vomiting.  All other systems reviewed and are negative.   Physical Exam Updated Vital Signs BP 109/81 (BP Location: Left Arm)   Pulse 96   Temp 98 F (36.7 C) (Oral)   Resp 18   LMP 04/03/2022 (Approximate)   SpO2 100%  Physical Exam Vitals and nursing note reviewed.  Constitutional:      Appearance: She is well-developed. She is obese. She is not ill-appearing.  HENT:     Head: Normocephalic and atraumatic.     Mouth/Throat:     Comments: Dry Eyes:     Pupils: Pupils are equal, round, and reactive to light.  Cardiovascular:     Rate and Rhythm: Regular rhythm. Tachycardia present.     Heart sounds: Normal heart sounds.  Pulmonary:     Effort: Pulmonary effort is normal. No respiratory distress.     Breath sounds: No wheezing.  Abdominal:  General: Bowel sounds are normal.     Palpations: Abdomen is soft.     Tenderness: There is generalized abdominal tenderness. There is no guarding or rebound.  Musculoskeletal:     Cervical back: Neck supple.  Skin:    General: Skin is warm and dry.  Neurological:     Mental Status: She is alert and oriented to person, place, and time.     ED Results / Procedures / Treatments   Labs (all labs ordered are listed, but only abnormal results are displayed) Labs Reviewed  COMPREHENSIVE METABOLIC PANEL - Abnormal; Notable for the following components:      Result Value   Glucose, Bld 107 (*)    Total Protein 9.2 (*)    AST 14 (*)    All other components within normal limits  CBC  - Abnormal; Notable for the following components:   WBC 11.0 (*)    MCV 79.8 (*)    MCH 24.6 (*)    Platelets 480 (*)    All other components within normal limits  LIPASE, BLOOD    EKG None  Radiology No results found.  Procedures Procedures    Medications Ordered in ED Medications  sodium chloride 0.9 % bolus 1,000 mL (0 mLs Intravenous Stopped 04/14/22 0321)  ondansetron (ZOFRAN) injection 4 mg (4 mg Intravenous Given 04/14/22 0146)    ED Course/ Medical Decision Making/ A&P                           Medical Decision Making Amount and/or Complexity of Data Reviewed Labs: ordered.  Risk Prescription drug management.   This patient presents to the ED for concern of nausea, vomiting, dehydration, this involves an extensive number of treatment options, and is a complaint that carries with it a high risk of complications and morbidity.  I considered the following differential and admission for this acute, potentially life threatening condition.  The differential diagnosis includes continued gastroenteritis, post viral gastritis, obstruction, pancreatitis, cholecystitis  MDM:    This is a 41 year old female who presents with ongoing vomiting and diarrhea.  She had some improvement of vomiting with Zofran but has had persistent diarrhea at home.  She was in urgent care and was noted to be tachycardic.  She was advised to come here for fluids with concerns for dehydration.  She reports some ongoing crampy abdominal pain.  However she has no signs of peritonitis on exam.  Labs reviewed.  Improved white count from 12.8-11.  No significant metabolic derangements.  Patient was given fluids and Zofran.  She had improvement of her symptoms.  Heart rate normalized.  Patient was able to tolerate p.o. without difficulty.  We discussed ongoing supportive measures.  At this time, would hold off on additional imaging given overall reassuring lab work.  She likely has some persistent  gastroenteritis.  (Labs, imaging, consults)  Labs: I Ordered, and personally interpreted labs.  The pertinent results include: CBC, CMP, lipase  Imaging Studies ordered: I ordered imaging studies including none I independently visualized and interpreted imaging. I agree with the radiologist interpretation  Additional history obtained from chart review.  External records from outside source obtained and reviewed including prior evaluations  Cardiac Monitoring: The patient was maintained on a cardiac monitor.  I personally viewed and interpreted the cardiac monitored which showed an underlying rhythm of: Sinus rhythm  Reevaluation: After the interventions noted above, I reevaluated the patient and found that they have :improved  Social Determinants of Health:  lives independently  Disposition: Discharge  Co morbidities that complicate the patient evaluation  Past Medical History:  Diagnosis Date   Diabetes mellitus without complication (HCC)      Medicines Meds ordered this encounter  Medications   sodium chloride 0.9 % bolus 1,000 mL   ondansetron (ZOFRAN) injection 4 mg    I have reviewed the patients home medicines and have made adjustments as needed  Problem List / ED Course: Problem List Items Addressed This Visit   None Visit Diagnoses     Gastroenteritis    -  Primary   Dehydration              ,md        Final Clinical Impression(s) / ED Diagnoses Final diagnoses:  Gastroenteritis  Dehydration    Rx / DC Orders ED Discharge Orders     None         Shon Baton, MD 04/14/22 2353

## 2022-04-14 NOTE — Discharge Instructions (Signed)
You were seen today for ongoing vomiting and diarrhea.  You are likely dehydrated.  Make sure that you are drinking plenty of fluids.  Continue Zofran for nausea.  You may also take Imodium for diarrhea.

## 2022-07-03 ENCOUNTER — Encounter (HOSPITAL_COMMUNITY): Payer: Self-pay

## 2022-07-03 ENCOUNTER — Emergency Department (HOSPITAL_COMMUNITY): Payer: BC Managed Care – PPO

## 2022-07-03 ENCOUNTER — Emergency Department (HOSPITAL_COMMUNITY)
Admission: EM | Admit: 2022-07-03 | Discharge: 2022-07-03 | Disposition: A | Discharge status: Home / Self Care | Payer: BC Managed Care – PPO | Attending: Emergency Medicine | Admitting: Emergency Medicine

## 2022-07-03 DIAGNOSIS — E119 Type 2 diabetes mellitus without complications: Secondary | ICD-10-CM | POA: Diagnosis not present

## 2022-07-03 DIAGNOSIS — R079 Chest pain, unspecified: Secondary | ICD-10-CM | POA: Insufficient documentation

## 2022-07-03 LAB — I-STAT BETA HCG BLOOD, ED (MC, WL, AP ONLY): I-stat hCG, quantitative: <5 m[IU]/mL (ref <5)

## 2022-07-03 LAB — CBC
HCT: 35.7 % — ABNORMAL LOW (ref 36.0–46.0)
Hemoglobin: 10.8 g/dL — ABNORMAL LOW (ref 12.0–15.0)
MCH: 24.7 pg — ABNORMAL LOW (ref 26.0–34.0)
MCHC: 30.3 g/dL (ref 30.0–36.0)
MCV: 81.5 fL (ref 80.0–100.0)
Platelets: 418 10*3/uL — ABNORMAL HIGH (ref 150–400)
RBC: 4.38 MIL/uL (ref 3.87–5.11)
RDW: 15.5 % (ref 11.5–15.5)
WBC: 11.3 10*3/uL — ABNORMAL HIGH (ref 4.0–10.5)
nRBC: 0 % (ref 0.0–0.2)

## 2022-07-03 LAB — BASIC METABOLIC PANEL
Anion gap: 8 (ref 5–15)
BUN: 9 mg/dL (ref 6–20)
CO2: 21 mmol/L — ABNORMAL LOW (ref 22–32)
Calcium: 8.5 mg/dL — ABNORMAL LOW (ref 8.9–10.3)
Chloride: 104 mmol/L (ref 98–111)
Creatinine, Ser: 0.66 mg/dL (ref 0.44–1.00)
GFR, Estimated: >60 mL/min (ref >60)
Glucose, Bld: 110 mg/dL — ABNORMAL HIGH (ref 70–99)
Potassium: 3.6 mmol/L (ref 3.5–5.1)
Sodium: 133 mmol/L — ABNORMAL LOW (ref 135–145)

## 2022-07-03 LAB — TROPONIN I (HIGH SENSITIVITY): Troponin I (High Sensitivity): <2 ng/L (ref <18)

## 2022-07-03 MED ORDER — KETOROLAC TROMETHAMINE 60 MG/2ML IM SOLN
60.0000 mg | Freq: Once | INTRAMUSCULAR | Status: AC
  Administered 2022-07-03: 60 mg via INTRAMUSCULAR
  Filled 2022-07-03: qty 2

## 2022-07-03 NOTE — ED Notes (Signed)
Discharge papers reviewed with pt/family and questions addressed. Pt ambulatory from ED

## 2022-07-03 NOTE — ED Triage Notes (Signed)
Pt states that she has been having CP on and off since yesterday, L sided, denies SOB, n/v, pain radiates to L arm

## 2022-07-03 NOTE — Discharge Instructions (Signed)
You were seen in the emergency department for some sharp upper left chest pain.  You had blood work EKG chest x-ray that did not show any significant findings.  This is likely muscular.  He can use Tylenol and ibuprofen, warm compress to the area.  Follow-up with your regular doctor.  Return to the emergency department if any worsening or concerning symptoms.

## 2022-07-03 NOTE — ED Provider Notes (Signed)
[place] EMERGENCY DEPARTMENT AT [facility] Provider Note   CSN: [id number] Arrival date & time: 07/03/22  2147     History {Add pertinent medical, surgical, social history, OB history to HPI:1} Chief Complaint  Patient presents with   Chest Pain    Haley Cunningham is a 42 y.o. female.  She has a history of diabetes.  She is complaining of some sharp left upper chest pain radiating into her shoulder that started yesterday but worsened today.  It is worse with movement and improved with palpation.  She does not feel short of breath.  No fevers chills cough nausea vomiting dizziness.  No prior history of cardiac disease.  Non-smoker.  The history is provided by the patient.  Chest Pain Pain location:  L chest Pain quality: stabbing   Pain radiates to:  L shoulder Pain severity:  Severe Onset quality:  Gradual Duration:  2 days Timing:  Intermittent Progression:  Worsening Chronicity:  New Relieved by:  Nothing Worsened by:  Certain positions Ineffective treatments:  Certain positions Associated symptoms: no cough, no diaphoresis, no dizziness, no nausea, no palpitations, no shortness of breath and no vomiting   Risk factors: diabetes mellitus   Risk factors: not pregnant and no smoking        Home Medications Prior to Admission medications   Medication Sig Start Date End Date Taking? Authorizing Provider  Barberry-Oreg Grape-Goldenseal (BERBERINE COMPLEX PO) Take by mouth.    [provider]  calcium-vitamin D (OSCAL WITH D) 500-200 MG-UNIT tablet Take 1 tablet by mouth.    [provider]  lidocaine (LIDODERM) 5 % Place 1 patch onto the skin daily. Remove & Discard patch within 12 hours or as directed by MD 03/09/21   Randal Buba, April, MD  loperamide (IMODIUM) 2 MG capsule Take 1 capsule (2 mg total) by mouth 4 (four) times daily as needed for diarrhea or loose stools. 01/09/22   White, Leitha Schuller, NP  MOUNJARO 2.5 MG/0.5ML Pen Inject into the  skin. 03/03/21   [provider]  ondansetron (ZOFRAN) 4 MG tablet Take 1 tablet (4 mg total) by mouth every 6 (six) hours. 04/10/22   Tretha Sciara, MD  promethazine (PHENERGAN) 12.5 MG tablet Take 1 tablet (12.5 mg total) by mouth every 6 (six) hours as needed for nausea or vomiting. 01/09/22   Hans Eden, NP  ferrous fumarate (HEMOCYTE - 106 MG FE) 325 (106 FE) MG TABS tablet Take 1 tablet by mouth.  11/05/19  [provider]  pantoprazole (PROTONIX) 20 MG tablet Take 1 tablet (20 mg total) by mouth 2 (two) times daily for 30 days. 09/05/18 11/05/19  LampteyMyrene Galas, MD      Allergies    Patient has no known allergies.    Review of Systems   Review of Systems  Constitutional:  Negative for diaphoresis.  Respiratory:  Negative for cough and shortness of breath.   Cardiovascular:  Positive for chest pain. Negative for palpitations.  Gastrointestinal:  Negative for nausea and vomiting.  Neurological:  Negative for dizziness.    Physical Exam Updated Vital Signs BP 114/72   Pulse 90   Temp 98.3 F (36.8 C)   Resp 18   Ht '5\' 8"'$  (1.727 m)   Wt 104.8 kg   SpO2 100%   BMI 35.12 kg/m  Physical Exam Vitals and nursing note reviewed.  Constitutional:      General: She is not in acute distress.    Appearance: Normal  appearance. She is well-developed.  HENT:     Head: Normocephalic and atraumatic.  Eyes:     Conjunctiva/sclera: Conjunctivae normal.  Cardiovascular:     Rate and Rhythm: Normal rate and regular rhythm.     Heart sounds: No murmur heard. Pulmonary:     Effort: Pulmonary effort is normal. No respiratory distress.     Breath sounds: Normal breath sounds.  Abdominal:     Palpations: Abdomen is soft.     Tenderness: There is no abdominal tenderness. There is no guarding or rebound.  Musculoskeletal:        General: No swelling.     Cervical back: Neck supple.  Skin:    General: Skin is warm and dry.     Capillary Refill: Capillary refill  takes less than 2 seconds.  Neurological:     General: No focal deficit present.     Mental Status: She is alert.     Sensory: No sensory deficit.     Motor: No weakness.     ED Results / Procedures / Treatments   Labs (all labs ordered are listed, but only abnormal results are displayed) Labs Reviewed  CBC - Abnormal; Notable for the following components:      Result Value   WBC 11.3 (*)    Hemoglobin 10.8 (*)    HCT 35.7 (*)    MCH 24.7 (*)    Platelets 418 (*)    All other components within normal limits  BASIC METABOLIC PANEL  I-STAT BETA HCG BLOOD, ED ([facility], [facility], [facility] ONLY)  TROPONIN I (HIGH SENSITIVITY)    EKG EKG Interpretation  Date/Time:  Tuesday July 03 2022 21:57:45 EST Ventricular Rate:  91 PR Interval:  146 QRS Duration: 87 QT Interval:  352 QTC Calculation: 433 R Axis:   57 Text Interpretation: Sinus rhythm No significant change since prior 12/23 Confirmed by Aletta Edouard [phone]) on 07/03/2022 9:59:51 PM  Radiology No results found.  Procedures Procedures  {Document cardiac monitor, telemetry assessment procedure when appropriate:1}  Medications Ordered in ED Medications  ketorolac (TORADOL) injection 60 mg (has no administration in time range)    ED Course/ Medical Decision Making/ A&P Clinical Course as of 07/03/22 2217  Tue Jul 03, 2022  2216 Chest x-ray interpreted by me as no acute infiltrates.  Awaiting radiology reading. [MB]    Clinical Course User Index [MB] Hayden Rasmussen, MD   {   Click here for ABCD2, HEART and other calculatorsREFRESH Note before signing :1}                          Medical Decision Making Amount and/or Complexity of Data Reviewed Labs: ordered. Radiology: ordered.  Risk Prescription drug management.   This patient complains of ***; this involves an extensive number of treatment Options and is a complaint that carries with it a high risk of complications and morbidity. The differential includes  ***  I ordered, reviewed and interpreted labs, which included *** I ordered medication *** and reviewed PMP when indicated. I ordered imaging studies which included *** and I independently    visualized and interpreted imaging which showed *** Additional history obtained from *** Previous records obtained and reviewed *** I consulted *** and discussed lab and imaging findings and discussed disposition.  Cardiac monitoring reviewed, *** Social determinants considered, *** Critical Interventions: ***  After the interventions stated above, I reevaluated the patient and found *** Admission and further testing considered, ***   {  Document critical care time when appropriate:1} {Document review of labs and clinical decision tools ie heart score, Chads2Vasc2 etc:1}  {Document your independent review of radiology images, and any outside records:1} {Document your discussion with family members, caretakers, and with consultants:1} {Document social determinants of health affecting pt's care:1} {Document your decision making why or why not admission, treatments were needed:1} Final Clinical Impression(s) / ED Diagnoses Final diagnoses:  None    Rx / DC Orders ED Discharge Orders     None

## 2022-07-22 ENCOUNTER — Other Ambulatory Visit: Payer: Self-pay

## 2022-07-22 ENCOUNTER — Encounter (HOSPITAL_COMMUNITY): Payer: Self-pay

## 2022-07-22 ENCOUNTER — Ambulatory Visit (HOSPITAL_COMMUNITY)
Admission: RE | Admit: 2022-07-22 | Discharge: 2022-07-22 | Disposition: A | Discharge status: Home / Self Care | Payer: BC Managed Care – PPO | Source: Ambulatory Visit | Attending: Emergency Medicine | Admitting: Emergency Medicine

## 2022-07-22 VITALS — BP 108/66 | HR 77 | Temp 98.4°F | Resp 20

## 2022-07-22 DIAGNOSIS — R0789 Other chest pain: Secondary | ICD-10-CM | POA: Diagnosis not present

## 2022-07-22 MED ORDER — CYCLOBENZAPRINE HCL 10 MG PO TABS: 10.0000 mg | ORAL_TABLET | Freq: Two times a day (BID) | ORAL | 0 refills | Status: DC | PRN

## 2022-07-22 MED ORDER — IBUPROFEN 600 MG PO TABS: 600.0000 mg | ORAL_TABLET | Freq: Three times a day (TID) | ORAL | 0 refills | Status: DC | PRN

## 2022-07-22 MED ORDER — ACETAMINOPHEN 325 MG PO TABS
650.0000 mg | ORAL_TABLET | Freq: Once | ORAL | Status: AC
  Administered 2022-07-22: 650 mg via ORAL

## 2022-07-22 MED ORDER — ACETAMINOPHEN 325 MG PO TABS
ORAL_TABLET | ORAL | Status: AC
  Filled 2022-07-22: qty 2

## 2022-07-22 NOTE — Discharge Instructions (Addendum)
Alternate between ibuprofen and Tylenol for the next several days to keep down pain and inflammation. You can also take the muscle relaxer twice daily.  If this medicine you drowsy, take only at bedtime.  If you need further work restrictions you can call the employee health and wellness clinic.

## 2022-07-22 NOTE — ED Triage Notes (Signed)
Complains of tight chest sensation.  Reports going to the hospital for the same 2 weeks ago (3/5).  They told her it was a muscle.  Denies sob, denies nausea, no vomiting.  Complains of this feeling like a tight pull  Patient reports receiving a shot in ED, has not taken any medicine.   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  Patient points to center chest as location of pulling sensation accompanied by intermittent sharp pain.

## 2022-07-22 NOTE — ED Provider Notes (Signed)
[place]    CSN: [id number] Arrival date & time: 07/22/22  1029     History   Chief Complaint Chief Complaint  Patient presents with   Appointment    10:00   Chest Pain    HPI Haley Cunningham is a 42 y.o. female.  Here with central chest discomfort that is felt only with movement.  She reports this is due to lifting at work.  She has a pulling sensation by the sternum.  Was in the hospital for the same on 3/5, had negative cardiac workup.  They suggested it was muscular and recommended she take medicines at home.  She has not taken anything for symptoms. Reports pain had gone away and came back yesterday   Denies fever, shortness of breath, abdominal pain, n/v/d  Past Medical History:  Diagnosis Date   Diabetes mellitus without complication ([place])     There are no problems to display for this patient.   History reviewed. No pertinent surgical history.  OB History     Gravida      Para      Term      Preterm      AB      Living  1      SAB      IAB      Ectopic      Multiple      Live Births  1            Home Medications    Prior to Admission medications   Medication Sig Start Date End Date Taking? Authorizing Provider  cyclobenzaprine (FLEXERIL) 10 MG tablet Take 1 tablet (10 mg total) by mouth 2 (two) times daily as needed for muscle spasms. 07/22/22  Yes Shakim Faith, Wells Guiles, PA-C  ibuprofen (ADVIL) 600 MG tablet Take 1 tablet (600 mg total) by mouth 3 (three) times daily as needed. 07/22/22  Yes Depaul Arizpe, PA-C  Barberry-Oreg Grape-Goldenseal (BERBERINE COMPLEX PO) Take by mouth.    [provider]  calcium-vitamin D (OSCAL WITH D) 500-200 MG-UNIT tablet Take 1 tablet by mouth.    [provider]  MOUNJARO 2.5 MG/0.5ML Pen Inject into the skin. 03/03/21   [provider]  ferrous fumarate (HEMOCYTE - 106 MG FE) 325 (106 FE) MG TABS tablet Take 1 tablet by mouth.  11/05/19  [provider]   pantoprazole (PROTONIX) 20 MG tablet Take 1 tablet (20 mg total) by mouth 2 (two) times daily for 30 days. 09/05/18 11/05/19  Lamptey, Myrene Galas, MD    Family History Family History  Problem Relation Age of Onset   Healthy Mother    Healthy Father    Diabetes Maternal Grandmother    Diabetes Maternal Grandfather     Social History Social History   Tobacco Use   Smoking status: Never   Smokeless tobacco: Never  Vaping Use   Vaping Use: Never used  Substance Use Topics   Alcohol use: Yes    Comment: occasionally   Drug use: No     Allergies   Patient has no known allergies.   Review of Systems Review of Systems As per HPI  Physical Exam Triage Vital Signs ED Triage Vitals  Enc Vitals Group     BP 07/22/22 1053 108/66     Pulse Rate 07/22/22 1053 77     Resp 07/22/22 1053 20     Temp 07/22/22 1053 98.4 F (36.9 C)     Temp Source 07/22/22 1053  Oral     SpO2 07/22/22 1053 97 %     Weight --      Height --      Head Circumference --      Peak Flow --      Pain Score 07/22/22 1051 6     Pain Loc --      Pain Edu? --      Excl. in [place]? --    No data found.  Updated Vital Signs BP 108/66 (BP Location: Left Arm) Comment (BP Location): large cuff  Pulse 77   Temp 98.4 F (36.9 C) (Oral)   Resp 20   LMP 07/08/2022   SpO2 97%    Physical Exam Vitals and nursing note reviewed.  Constitutional:      General: She is not in acute distress.    Appearance: She is not ill-appearing.  HENT:     Mouth/Throat:     Mouth: Mucous membranes are moist.     Pharynx: Oropharynx is clear.  Cardiovascular:     Rate and Rhythm: Normal rate and regular rhythm.     Pulses: Normal pulses.     Heart sounds: Normal heart sounds.  Pulmonary:     Effort: Pulmonary effort is normal.     Breath sounds: Normal breath sounds.  Chest:     Chest wall: Tenderness present.     Comments: Tender along sternum and anterior chest muscles. Pain worse with chest motion  Musculoskeletal:         General: Normal range of motion.     Cervical back: Normal range of motion.     Right lower leg: No edema.     Left lower leg: No edema.  Skin:    Findings: No bruising.  Neurological:     Mental Status: She is alert and oriented to person, place, and time.     UC Treatments / Results  Labs (all labs ordered are listed, but only abnormal results are displayed) Labs Reviewed - No data to display  EKG  Radiology No results found.  Procedures Procedures (including critical care time)  Medications Ordered in UC Medications  acetaminophen (TYLENOL) tablet 650 mg (650 mg Oral Given 07/22/22 1108)    Initial Impression / Assessment and Plan / UC Course  I have reviewed the triage vital signs and the nursing notes.  Pertinent labs & imaging results that were available during my care of the patient were reviewed by me and considered in my medical decision making (see chart for details).  Reproducible tenderness. Felt only with movement. Likely muscle vs cartilage.  Tylenol dose given in clinic.  Discussed use of ibuprofen and Tylenol at home for inflammation.  Can also try muscle relaxer twice daily. Work note provided.  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  Final Clinical Impressions(s) / UC Diagnoses   Final diagnoses:  Chest wall pain     Discharge Instructions      Alternate between ibuprofen and Tylenol for the next several days to keep down pain and inflammation. You can also take the muscle relaxer twice daily.  If this medicine you drowsy, take only at bedtime.  If you need further work restrictions you can call the employee health and wellness clinic.     ED Prescriptions     Medication Sig Dispense Auth. Provider   cyclobenzaprine (FLEXERIL) 10 MG tablet Take 1 tablet (10 mg total) by mouth 2 (two) times daily as needed for muscle spasms.  20 tablet Nylan Nakatani, PA-C   ibuprofen (ADVIL) 600 MG tablet Take 1  tablet (600 mg total) by mouth 3 (three) times daily as needed. 21 tablet Mane Consolo, Wells Guiles, PA-C      PDMP not reviewed this encounter.   [place], [place] 07/22/22 1148

## 2022-08-16 ENCOUNTER — Encounter (HOSPITAL_COMMUNITY): Payer: Self-pay

## 2022-08-16 ENCOUNTER — Ambulatory Visit (HOSPITAL_COMMUNITY)
Admission: RE | Admit: 2022-08-16 | Discharge: 2022-08-16 | Disposition: A | Discharge status: Home / Self Care | Payer: BC Managed Care – PPO | Source: Ambulatory Visit | Attending: Internal Medicine | Admitting: Internal Medicine

## 2022-08-16 VITALS — BP 115/77 | HR 105 | Temp 98.6°F | Resp 18

## 2022-08-16 DIAGNOSIS — E1169 Type 2 diabetes mellitus with other specified complication: Secondary | ICD-10-CM

## 2022-08-16 DIAGNOSIS — A084 Viral intestinal infection, unspecified: Secondary | ICD-10-CM | POA: Diagnosis not present

## 2022-08-16 LAB — CBG MONITORING, ED: Glucose-Capillary: 105 mg/dL — ABNORMAL HIGH (ref 70–99)

## 2022-08-16 MED ORDER — ONDANSETRON 4 MG PO TBDP
ORAL_TABLET | ORAL | Status: AC
  Filled 2022-08-16: qty 1

## 2022-08-16 MED ORDER — ONDANSETRON 4 MG PO TBDP: 4.0000 mg | ORAL_TABLET | Freq: Three times a day (TID) | ORAL | 0 refills | Status: DC | PRN

## 2022-08-16 MED ORDER — ONDANSETRON 4 MG PO TBDP
4.0000 mg | ORAL_TABLET | Freq: Once | ORAL | Status: AC
  Administered 2022-08-16: 4 mg via ORAL

## 2022-08-16 NOTE — Discharge Instructions (Addendum)
Your evaluation suggests that your symptoms are most likely due to viral stomach illness (gastroenteritis) which will improve on its own with rest and fluids in the next few days.   I have prescribed an antinausea medication for you to take at home called Zofran. It is the same medication that we gave you in the office.  You may use tylenol 1,000mg  every 6 hours over the counter as needed for abdominal discomfort related to this virus.   Eat a bland diet for the next 12-24 hours (bananas, rice, white toast, and applesauce) once you are able to tolerate liquids (broth, etc). These foods are easy for your stomach to digest. Pedialyte can be purchased to help with rehydration. Drink plenty of water.   Continue to check your sugars over the next few days to ensure they remain normal.  If you develop any new or worsening symptoms or do not improve in the next 2 to 3 days, please return.  If your symptoms are severe, please go to the emergency room.  Follow-up with your primary care provider for further evaluation and management of your symptoms as well as ongoing wellness visits.  I hope you feel better!

## 2022-08-16 NOTE — ED Provider Notes (Addendum)
[facility]    CSN: [id number] Arrival date & time: 08/16/22  1616      History   Chief Complaint Chief Complaint  Patient presents with   Nausea    Stomach pains and chest pains - Entered by patient   Abdominal Pain    HPI Haley Cunningham is a 42 y.o. female.   Patient presents to urgent care for evaluation of nausea, vomiting, dry mouth, and abdominal pain that started 2 days ago in the evening of Tuesday August 14, 2022. Symptoms started suddenly with nausea and vomiting. Non-bilious/non-bloody emesis. Last episode of emesis was mid-day yesterday. She has been drinking electrolyte drinks due to low appetite to try to stay well hydrated despite vomiting. No diarrhea. Experiencing intermittent abdominal pain to the lower abdomen. Reports intermittent nausea and dizziness that has persisted throughout the last 2 days. She is a type 2 diabetic. She has been checking her sugars at home while sick and says they've been stable and around 100s (her baseline). No recent medication changes or fever/chills. There have been multiple call outs at work and she believes she may have been exposed to viral illness there but is unsure.  His antibiotic or steroid use.  Denies urinary symptoms, vaginal symptoms, flank pain, body aches, and blood/mucus in the stools.  She has not attempted use of any over the counter medicines to help with symptoms.    Abdominal Pain   Past Medical History:  Diagnosis Date   Diabetes mellitus without complication     There are no problems to display for this patient.   History reviewed. No pertinent surgical history.  OB History     Gravida      Para      Term      Preterm      AB      Living  1      SAB      IAB      Ectopic      Multiple      Live Births  1            Home Medications    Prior to Admission medications   Medication Sig Start Date End Date Taking? Authorizing Provider  ondansetron (ZOFRAN-ODT) 4 MG  disintegrating tablet Take 1 tablet (4 mg total) by mouth every 8 (eight) hours as needed for nausea or vomiting. 08/16/22  Yes Rafael Quesada, Donavan Burnet, FNP  Barberry-Oreg Grape-Goldenseal (BERBERINE COMPLEX PO) Take by mouth.    [provider]  calcium-vitamin D (OSCAL WITH D) 500-200 MG-UNIT tablet Take 1 tablet by mouth.    [provider]  cyclobenzaprine (FLEXERIL) 10 MG tablet Take 1 tablet (10 mg total) by mouth 2 (two) times daily as needed for muscle spasms. 07/22/22   Rising, Lurena Joiner, PA-C  ibuprofen (ADVIL) 600 MG tablet Take 1 tablet (600 mg total) by mouth 3 (three) times daily as needed. 07/22/22   Rising, Lurena Joiner, PA-C  MOUNJARO 2.5 MG/0.5ML Pen Inject into the skin. 03/03/21   [provider]  ferrous fumarate (HEMOCYTE - 106 MG FE) 325 (106 FE) MG TABS tablet Take 1 tablet by mouth.  11/05/19  [provider]  pantoprazole (PROTONIX) 20 MG tablet Take 1 tablet (20 mg total) by mouth 2 (two) times daily for 30 days. 09/05/18 11/05/19  LampteyBritta Mccreedy, MD    Family History Family History  Problem Relation Age of Onset   Healthy Mother    Healthy Father  Diabetes Maternal Grandmother    Diabetes Maternal Grandfather     Social History Social History   Tobacco Use   Smoking status: Never   Smokeless tobacco: Never  Vaping Use   Vaping Use: Never used  Substance Use Topics   Alcohol use: Yes    Comment: occasionally   Drug use: No     Allergies   Patient has no known allergies.   Review of Systems Review of Systems  Gastrointestinal:  Positive for abdominal pain.  Per HPI   Physical Exam Triage Vital Signs ED Triage Vitals [08/16/22 1653]  Enc Vitals Group     BP 115/77     Pulse Rate (!) 105     Resp 18     Temp 98.6 F (37 C)     Temp Source Oral     SpO2 96 %     Weight      Height      Head Circumference      Peak Flow      Pain Score 6     Pain Loc      Pain Edu?      Excl. in GC?    No data  found.  Updated Vital Signs BP 115/77 (BP Location: Left Arm)   Pulse (!) 105   Temp 98.6 F (37 C) (Oral)   Resp 18   LMP 08/12/2022   SpO2 96%   Visual Acuity Right Eye Distance:   Left Eye Distance:   Bilateral Distance:    Right Eye Near:   Left Eye Near:    Bilateral Near:     Physical Exam Vitals and nursing note reviewed.  Constitutional:      Appearance: She is ill-appearing. She is not toxic-appearing.  HENT:     Head: Normocephalic and atraumatic.     Right Ear: Hearing and external ear normal.     Left Ear: Hearing and external ear normal.     Nose: Nose normal.     Mouth/Throat:     Lips: Pink.     Mouth: Mucous membranes are moist. No injury.     Tongue: No lesions. Tongue does not deviate from midline.     Palate: No mass and lesions.     Pharynx: Oropharynx is clear. Uvula midline. No pharyngeal swelling, oropharyngeal exudate, posterior oropharyngeal erythema or uvula swelling.     Tonsils: No tonsillar exudate or tonsillar abscesses.  Eyes:     General: Lids are normal. Vision grossly intact. Gaze aligned appropriately.     Extraocular Movements: Extraocular movements intact.     Conjunctiva/sclera: Conjunctivae normal.  Cardiovascular:     Rate and Rhythm: Normal rate and regular rhythm.     Heart sounds: Normal heart sounds, S1 normal and S2 normal.  Pulmonary:     Effort: Pulmonary effort is normal. No respiratory distress.     Breath sounds: Normal breath sounds and air entry.  Abdominal:     General: Abdomen is flat. Bowel sounds are normal.     Palpations: Abdomen is soft.     Tenderness: There is generalized abdominal tenderness. There is no right CVA tenderness, left CVA tenderness or guarding.     Comments: No peritoneal signs to abdominal exam.  Musculoskeletal:     Cervical back: Neck supple.  Skin:    General: Skin is warm and dry.     Capillary Refill: Capillary refill takes less than 2 seconds.     Findings: No rash.  Neurological:     General: No focal deficit present.     Mental Status: She is alert and oriented to person, place, and time. Mental status is at baseline.     Cranial Nerves: No dysarthria or facial asymmetry.  Psychiatric:        Mood and Affect: Mood normal.        Speech: Speech normal.        Behavior: Behavior normal.        Thought Content: Thought content normal.        Judgment: Judgment normal.      UC Treatments / Results  Labs (all labs ordered are listed, but only abnormal results are displayed) Labs Reviewed  CBG MONITORING, ED - Abnormal; Notable for the following components:      Result Value   Glucose-Capillary 105 (*)    All other components within normal limits  POCT FASTING CBG K[facility] MANUAL ENTRY    EKG   Radiology No results found.  Procedures Procedures (including critical care time)  Medications Ordered in UC Medications  ondansetron (ZOFRAN-ODT) disintegrating tablet 4 mg (has no administration in time range)    Initial Impression / Assessment and Plan / UC Course  I have reviewed the triage vital signs and the nursing notes.  Pertinent labs & imaging results that were available during my care of the patient were reviewed by me and considered in my medical decision making (see chart for details).   1. Viral gastroenteritis Presentation is consistent with acute viral gastroenteritis that will likely improve with rest, increased fluid intake, and as needed use of antiemetics. Abdominal exam is without peritoneal signs, patient is nontoxic in appearance, and vitals are hemodynamically stable. Push fluids with water and pedialyte for rehydration.  Zofran 4 mg ODT given in clinic for nausea and vomiting. May use zofran 4mg  ODT every 8 hours as needed for nausea and vomiting. May use Tylenol for abdominal discomfort at home related to viral illness. Bland/liquid diet recommended for 12-24 hours, then increase diet to normal as tolerated.  Patient does  not appear to be dehydrated to physical exam, however heart rate is slightly elevated at 105 in regular rhythm.  Advised to push fluids.  If symptoms fail to improve in the next 2 to 3 days or if she develops any new or worsening severe symptoms, advised to return or go to the nearest emergency department for reevaluation.  CBG 108 in clinic.  Discussed physical exam and available lab work findings in clinic with patient.  Counseled patient regarding appropriate use of medications and potential side effects for all medications recommended or prescribed today. Discussed red flag signs and symptoms of worsening condition,when to call the PCP office, return to urgent care, and when to seek higher level of care in the emergency department. Patient verbalizes understanding and agreement with plan. All questions answered. Patient discharged in stable condition.    Final Clinical Impressions(s) / UC Diagnoses   Final diagnoses:  Viral gastroenteritis  Type 2 diabetes mellitus with other specified complication, without long-term current use of insulin     Discharge Instructions      Your evaluation suggests that your symptoms are most likely due to viral stomach illness (gastroenteritis) which will improve on its own with rest and fluids in the next few days.   I have prescribed an antinausea medication for you to take at home called Zofran. It is the same medication that we gave you in the office.  You  may use tylenol 1,000mg  every 6 hours over the counter as needed for abdominal discomfort related to this virus.   Eat a bland diet for the next 12-24 hours (bananas, rice, white toast, and applesauce) once you are able to tolerate liquids (broth, etc). These foods are easy for your stomach to digest. Pedialyte can be purchased to help with rehydration. Drink plenty of water.   Continue to check your sugars over the next few days to ensure they remain normal.  If you develop any new or worsening  symptoms or do not improve in the next 2 to 3 days, please return.  If your symptoms are severe, please go to the emergency room.  Follow-up with your primary care provider for further evaluation and management of your symptoms as well as ongoing wellness visits.  I hope you feel better!     ED Prescriptions     Medication Sig Dispense Auth. Provider   ondansetron (ZOFRAN-ODT) 4 MG disintegrating tablet Take 1 tablet (4 mg total) by mouth every 8 (eight) hours as needed for nausea or vomiting. 20 tablet Carlisle Beers, FNP      PDMP not reviewed this encounter.   Carlisle Beers, FNP 08/16/22 1719    Carlisle Beers, FNP 08/16/22 1719

## 2022-08-16 NOTE — ED Triage Notes (Signed)
Pt states had vomiting Tuesday and Wednesday. States having lower abdominal pain, upper chest pain, and mouth dryness since Tuesday. States feels weak. States is a DM but hasn't checked her sugar. States having nausea and keeping fluids down today.

## 2022-10-23 ENCOUNTER — Ambulatory Visit: Payer: Self-pay

## 2022-10-30 ENCOUNTER — Encounter (HOSPITAL_COMMUNITY): Payer: Self-pay

## 2022-10-30 ENCOUNTER — Other Ambulatory Visit: Payer: Self-pay

## 2022-10-30 ENCOUNTER — Ambulatory Visit (INDEPENDENT_AMBULATORY_CARE_PROVIDER_SITE_OTHER): Payer: BC Managed Care – PPO

## 2022-10-30 ENCOUNTER — Ambulatory Visit (HOSPITAL_COMMUNITY)
Admission: RE | Admit: 2022-10-30 | Discharge: 2022-10-30 | Disposition: A | Discharge status: Home / Self Care | Payer: BC Managed Care – PPO | Source: Ambulatory Visit | Attending: Physician Assistant | Admitting: Physician Assistant

## 2022-10-30 VITALS — BP 160/80 | HR 100 | Temp 98.0°F | Resp 18 | Ht 68.0 in | Wt 229.0 lb

## 2022-10-30 DIAGNOSIS — G8929 Other chronic pain: Secondary | ICD-10-CM

## 2022-10-30 DIAGNOSIS — M79671 Pain in right foot: Secondary | ICD-10-CM

## 2022-10-30 MED ORDER — PREDNISONE 10 MG PO TABS: 40.0000 mg | ORAL_TABLET | Freq: Every day | ORAL | 0 refills | Status: AC

## 2022-10-30 NOTE — ED Provider Notes (Signed)
[facility]    CSN: [id number] Arrival date & time: 10/30/22  1724      History   Chief Complaint Chief Complaint  Patient presents with   Foot Pain    My heel of my right foot has a sharp pain that comes and goes.. - Entered by patient    HPI Haley Cunningham is a 42 y.o. female.   Patient complains of right heel pain that started several months ago and became worse over the last 3 days.  She reports that she is on her feet a lot at work.  Works in [employer].  She reports pain is worse first thing in the morning.  Denies injury or trauma.    Past Medical History:  Diagnosis Date   Diabetes mellitus without complication (HCC)     There are no problems to display for this patient.   History reviewed. No pertinent surgical history.  OB History     Gravida      Para      Term      Preterm      AB      Living  1      SAB      IAB      Ectopic      Multiple      Live Births  1            Home Medications    Prior to Admission medications   Medication Sig Start Date End Date Taking? Authorizing Provider  predniSONE (DELTASONE) 10 MG tablet Take 4 tablets (40 mg total) by mouth daily for 5 days. 10/30/22 11/04/22 Yes Ward, Tylene Fantasia, PA-C  Barberry-Oreg Grape-Goldenseal (BERBERINE COMPLEX PO) Take by mouth.    [provider]  calcium-vitamin D (OSCAL WITH D) 500-200 MG-UNIT tablet Take 1 tablet by mouth.    [provider]  cyclobenzaprine (FLEXERIL) 10 MG tablet Take 1 tablet (10 mg total) by mouth 2 (two) times daily as needed for muscle spasms. 07/22/22   Rising, Lurena Joiner, PA-C  ibuprofen (ADVIL) 600 MG tablet Take 1 tablet (600 mg total) by mouth 3 (three) times daily as needed. 07/22/22   Rising, Lurena Joiner, PA-C  MOUNJARO 2.5 MG/0.5ML Pen Inject into the skin. 03/03/21   [provider]  ondansetron (ZOFRAN-ODT) 4 MG disintegrating tablet Take 1 tablet (4 mg total) by mouth every 8 (eight) hours as needed for nausea  or vomiting. 08/16/22   Carlisle Beers, FNP  ferrous fumarate (HEMOCYTE - 106 MG FE) 325 (106 FE) MG TABS tablet Take 1 tablet by mouth.  11/05/19  [provider]  pantoprazole (PROTONIX) 20 MG tablet Take 1 tablet (20 mg total) by mouth 2 (two) times daily for 30 days. 09/05/18 11/05/19  Lamptey, Britta Mccreedy, MD    Family History Family History  Problem Relation Age of Onset   Healthy Mother    Healthy Father    Diabetes Maternal Grandmother    Diabetes Maternal Grandfather     Social History Social History   Tobacco Use   Smoking status: Never   Smokeless tobacco: Never  Vaping Use   Vaping Use: Never used  Substance Use Topics   Alcohol use: Yes    Comment: occasionally   Drug use: No     Allergies   Patient has no known allergies.   Review of Systems Review of Systems  Constitutional:  Negative for chills and fever.  HENT:  Negative for ear pain and sore  throat.   Eyes:  Negative for pain and visual disturbance.  Respiratory:  Negative for cough and shortness of breath.   Cardiovascular:  Negative for chest pain and palpitations.  Gastrointestinal:  Negative for abdominal pain and vomiting.  Genitourinary:  Negative for dysuria and hematuria.  Musculoskeletal:  Positive for arthralgias. Negative for back pain.  Skin:  Negative for color change and rash.  Neurological:  Negative for seizures and syncope.  All other systems reviewed and are negative.    Physical Exam Triage Vital Signs ED Triage Vitals [10/30/22 1734]  Enc Vitals Group     BP (!) 160/80     Pulse Rate 100     Resp 18     Temp 98 F (36.7 C)     Temp Source Oral     SpO2 100 %     Weight 229 lb (103.9 kg)     Height 5\' 8"  (1.727 m)     Head Circumference      Peak Flow      Pain Score 9     Pain Loc      Pain Edu?      Excl. in GC?    No data found.  Updated Vital Signs BP (!) 160/80 (BP Location: Right Arm)   Pulse 100   Temp 98 F (36.7 C) (Oral)   Resp 18   Ht 5'  8" (1.727 m)   Wt 229 lb (103.9 kg)   LMP 09/30/2022 (Approximate)   SpO2 100%   BMI 34.82 kg/m   Visual Acuity Right Eye Distance:   Left Eye Distance:   Bilateral Distance:    Right Eye Near:   Left Eye Near:    Bilateral Near:     Physical Exam Vitals and nursing note reviewed.  Constitutional:      General: She is not in acute distress.    Appearance: She is well-developed.  HENT:     Head: Normocephalic and atraumatic.  Eyes:     Conjunctiva/sclera: Conjunctivae normal.  Cardiovascular:     Rate and Rhythm: Normal rate and regular rhythm.     Heart sounds: No murmur heard. Pulmonary:     Effort: Pulmonary effort is normal. No respiratory distress.     Breath sounds: Normal breath sounds.  Abdominal:     Palpations: Abdomen is soft.     Tenderness: There is no abdominal tenderness.  Musculoskeletal:        General: No swelling.     Cervical back: Neck supple.  Feet:     Comments: Tenderness to palpation to posterior right heel.  Skin:    General: Skin is warm and dry.     Capillary Refill: Capillary refill takes less than 2 seconds.  Neurological:     Mental Status: She is alert.  Psychiatric:        Mood and Affect: Mood normal.      UC Treatments / Results  Labs (all labs ordered are listed, but only abnormal results are displayed) Labs Reviewed - No data to display  EKG   Radiology DG Foot Complete Right  Result Date: 10/30/2022 CLINICAL DATA:  Pain EXAM: RIGHT FOOT COMPLETE - 3+ VIEW COMPARISON:  None Available. FINDINGS: No recent fracture or dislocation is seen. No focal lytic lesions are seen. Mild hallux valgus deformity is noted. There is tiny plantar spur in calcaneus. IMPRESSION: No acute findings are seen in right foot. Electronically Signed   By: Harlan Stains.D.  On: 10/30/2022 18:03    Procedures Procedures (including critical care time)  Medications Ordered in UC Medications - No data to display  Initial Impression /  Assessment and Plan / UC Course  I have reviewed the triage vital signs and the nursing notes.  Pertinent labs & imaging results that were available during my care of the patient were reviewed by me and considered in my medical decision making (see chart for details).     Right heel pain.  Imaging negative for fracture.  There is a right bone spur which may be attributing to her pain.  Will prescribe short course of steroids.  Advised stretching.  Ice as needed.  Hard sole shoe given in clinic today.  If no improvement advised follow up with ortho.  Final Clinical Impressions(s) / UC Diagnoses   Final diagnoses:  Heel pain, chronic, right     Discharge Instructions      Take prednisone as prescribed Recommend daily stretching  Apply ice Wear hard sole shoe for comfort  If no improvement follow up with orthopedics   ED Prescriptions     Medication Sig Dispense Auth. Provider   predniSONE (DELTASONE) 10 MG tablet Take 4 tablets (40 mg total) by mouth daily for 5 days. 20 tablet Ward, Tylene Fantasia, PA-C      PDMP not reviewed this encounter.   Ward, Tylene Fantasia, PA-C 10/30/22 [phone]

## 2022-10-30 NOTE — ED Triage Notes (Signed)
Pt c/o right foot pain for a month getting worse on the past 3 days.

## 2022-10-30 NOTE — Discharge Instructions (Addendum)
Take prednisone as prescribed Recommend daily stretching  Apply ice Wear hard sole shoe for comfort  If no improvement follow up with orthopedics

## 2022-11-16 ENCOUNTER — Ambulatory Visit (HOSPITAL_COMMUNITY)
Admission: EM | Admit: 2022-11-16 | Discharge: 2022-11-16 | Disposition: A | Discharge status: Home / Self Care | Payer: BC Managed Care – PPO | Source: Home / Self Care

## 2022-11-16 ENCOUNTER — Encounter (HOSPITAL_COMMUNITY): Payer: Self-pay

## 2022-11-16 DIAGNOSIS — M545 Low back pain, unspecified: Secondary | ICD-10-CM | POA: Diagnosis not present

## 2022-11-16 MED ORDER — METHOCARBAMOL 500 MG PO TABS: 500.0000 mg | ORAL_TABLET | Freq: Two times a day (BID) | ORAL | 0 refills | Status: DC

## 2022-11-16 MED ORDER — NAPROXEN 500 MG PO TABS: 500.0000 mg | ORAL_TABLET | Freq: Two times a day (BID) | ORAL | 0 refills | Status: DC

## 2022-11-16 NOTE — ED Triage Notes (Signed)
Patient c/o mid lower back pain that started today. 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  Patient denies taking anything for pain today.

## 2022-11-16 NOTE — ED Provider Notes (Signed)
[facility]    CSN: [id number] Arrival date & time: 11/16/22  1735      History   Chief Complaint Chief Complaint  Patient presents with   Back Pain    HPI Haley Cunningham is a 42 y.o. female.   Patient presents to clinic for midline back pain that started after work yesterday. 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  Afterwards she noticed she did have some lower back pain, but it was tolerable.  When she woke this morning she noticed that her pain was much more pronounced and severe.  She has a sharp pain in her back with flexion and extension.  Denies any numbness, tingling, radiation or incontinence.  No previous back injuries.  No back surgeries.  No known trauma or falls.  The history is provided by the patient and medical records.  Back Pain   Past Medical History:  Diagnosis Date   Diabetes mellitus without complication (HCC)     There are no problems to display for this patient.   History reviewed. No pertinent surgical history.  OB History     Gravida      Para      Term      Preterm      AB      Living  1      SAB      IAB      Ectopic      Multiple      Live Births  1            Home Medications    Prior to Admission medications   Medication Sig Start Date End Date Taking? Authorizing Provider  methocarbamol (ROBAXIN) 500 MG tablet Take 1 tablet (500 mg total) by mouth 2 (two) times daily. 11/16/22  Yes Rinaldo Ratel, [place] N, FNP  naproxen (NAPROSYN) 500 MG tablet Take 1 tablet (500 mg total) by mouth 2 (two) times daily. 11/16/22  Yes Rinaldo Ratel, [place] N, FNP  MOUNJARO 2.5 MG/0.5ML Pen Inject into the skin. 03/03/21   [provider]  ferrous fumarate (HEMOCYTE - 106 MG FE) 325 (106 FE) MG TABS tablet Take 1 tablet by mouth.  11/05/19  [provider]  pantoprazole (PROTONIX) 20 MG tablet Take 1 tablet (20 mg total) by mouth 2 (two) times daily for 30 days.  09/05/18 11/05/19  Lamptey, Britta Mccreedy, MD    Family History Family History  Problem Relation Age of Onset   Healthy Mother    Healthy Father    Diabetes Maternal Grandmother    Diabetes Maternal Grandfather     Social History Social History   Tobacco Use   Smoking status: Never   Smokeless tobacco: Never  Vaping Use   Vaping status: Never Used  Substance Use Topics   Alcohol use: Yes    Comment: occasionally   Drug use: No     Allergies   Patient has no known allergies.   Review of Systems Review of Systems  Musculoskeletal:  Positive for back pain.     Physical Exam Triage Vital Signs ED Triage Vitals  Encounter Vitals Group     BP 11/16/22 1802 105/69     Systolic BP Percentile --      Diastolic BP Percentile --      Pulse Rate 11/16/22 1802 91     Resp 11/16/22 1802 16     Temp 11/16/22 1802 98.1 F (36.7 C)  Temp Source 11/16/22 1802 Oral     SpO2 11/16/22 1802 97 %     Weight --      Height --      Head Circumference --      Peak Flow --      Pain Score 11/16/22 1806 10     Pain Loc --      Pain Education --      Exclude from Growth Chart --    No data found.  Updated Vital Signs BP 105/69 (BP Location: Right Arm)   Pulse 91   Temp 98.1 F (36.7 C) (Oral)   Resp 16   LMP 11/08/2022 (Approximate)   SpO2 97%   Visual Acuity Right Eye Distance:   Left Eye Distance:   Bilateral Distance:    Right Eye Near:   Left Eye Near:    Bilateral Near:     Physical Exam Vitals and nursing note reviewed.  Constitutional:      Appearance: Normal appearance.  HENT:     Head: Normocephalic and atraumatic.     Right Ear: External ear normal.     Left Ear: External ear normal.     Nose: Nose normal.     Mouth/Throat:     Mouth: Mucous membranes are moist.  Eyes:     Conjunctiva/sclera: Conjunctivae normal.  Cardiovascular:     Rate and Rhythm: Normal rate.  Pulmonary:     Effort: Pulmonary effort is normal. No respiratory distress.   Musculoskeletal:        General: Tenderness and signs of injury present. No swelling or deformity.     Cervical back: Normal and normal range of motion.     Thoracic back: Normal.     Lumbar back: Tenderness and bony tenderness present. No swelling, deformity or signs of trauma. Decreased range of motion. Negative right straight leg raise test and negative left straight leg raise test.       Back:  Skin:    General: Skin is warm and dry.  Neurological:     General: No focal deficit present.     Mental Status: She is alert and oriented to person, place, and time.  Psychiatric:        Mood and Affect: Mood normal.        Behavior: Behavior normal. Behavior is cooperative.      UC Treatments / Results  Labs (all labs ordered are listed, but only abnormal results are displayed) Labs Reviewed - No data to display  EKG   Radiology No results found.  Procedures Procedures (including critical care time)  Medications Ordered in UC Medications - No data to display  Initial Impression / Assessment and Plan / UC Course  I have reviewed the triage vital signs and the nursing notes.  Pertinent labs & imaging results that were available during my care of the patient were reviewed by me and considered in my medical decision making (see chart for details).  Vitals and triage reviewed, patient is hemodynamically stable.  Has midline lumbar back pain after lifting heavy liquor crates yesterday.  Without red flag symptoms of cauda equina, incontinence, numbness or tingling.  Steady gait, negative straight leg raise.  Discussed that we could obtain imaging in clinic, but advised to withhold due to lack of trauma.  Will place on anti-inflammatories and muscle relaxers, encouraged to follow-up with Ortho if symptoms persist.  Discussed red flag symptoms that would warrant immediate evaluation, follow-up care and return precautions given.  No questions at this time.  Work note provided.      Final Clinical Impressions(s) / UC Diagnoses   Final diagnoses:  Acute midline low back pain without sciatica     Discharge Instructions      Your back pain appears to be musculoskeletal in nature.  Please rest, ice or heat the area, take the naproxen and the muscle relaxer Robaxin.  Do not drink or drive on the muscle relaxer as it may make you drowsy.  I suggest taking it easy the next few days and taking a few days off of work to help heal.  Please ensure you are using proper lifting posture when lifting the heavy liquor crates.  If your pain persist beyond the next week, please follow-up with sports medicine for further evaluation.  If you develop any changes to your pain, extreme worsening of pain, numbness, incontinence, or inner leg numbness, please seek immediate care.       ED Prescriptions     Medication Sig Dispense Auth. Provider   methocarbamol (ROBAXIN) 500 MG tablet Take 1 tablet (500 mg total) by mouth 2 (two) times daily. 20 tablet Rinaldo Ratel, [place] N, [place]   naproxen (NAPROSYN) 500 MG tablet Take 1 tablet (500 mg total) by mouth 2 (two) times daily. 30 tablet Iley Breeden, [place] N, [place]      PDMP not reviewed this encounter.   Gaberiel Youngblood, [place] N, [place] 11/16/22 [phone]

## 2022-11-16 NOTE — Discharge Instructions (Addendum)
Your back pain appears to be musculoskeletal in nature.  Please rest, ice or heat the area, take the naproxen and the muscle relaxer Robaxin.  Do not drink or drive on the muscle relaxer as it may make you drowsy.  I suggest taking it easy the next few days and taking a few days off of work to help heal.  Please ensure you are using proper lifting posture when lifting the heavy liquor crates.  If your pain persist beyond the next week, please follow-up with sports medicine for further evaluation.  If you develop any changes to your pain, extreme worsening of pain, numbness, incontinence, or inner leg numbness, please seek immediate care.

## 2022-11-17 ENCOUNTER — Ambulatory Visit (HOSPITAL_COMMUNITY): Payer: Self-pay

## 2022-11-21 ENCOUNTER — Other Ambulatory Visit: Payer: Self-pay

## 2022-11-21 DIAGNOSIS — K625 Hemorrhage of anus and rectum: Secondary | ICD-10-CM | POA: Insufficient documentation

## 2022-11-21 NOTE — ED Triage Notes (Signed)
Reports bright red blood in stool and when wiping. Several days. Some pain with BM. No large clots or dark blood notes.

## 2022-11-22 ENCOUNTER — Emergency Department (HOSPITAL_BASED_OUTPATIENT_CLINIC_OR_DEPARTMENT_OTHER)
Admission: EM | Admit: 2022-11-22 | Discharge: 2022-11-22 | Disposition: A | Discharge status: Home / Self Care | Payer: BC Managed Care – PPO | Attending: Emergency Medicine | Admitting: Emergency Medicine

## 2022-11-22 DIAGNOSIS — K625 Hemorrhage of anus and rectum: Secondary | ICD-10-CM

## 2022-11-22 NOTE — Discharge Instructions (Signed)
Begin taking Metamucil 1 heaping teaspoon in a glass of water 3 times daily.  Begin taking Colace (equate stool softener) 100 mg twice daily.  Follow-up with gastroenterology in the next 1 to 2 weeks.  The contact information for [facility] has been provided in this discharge summary for you to call and make these arrangements.  Return to the emergency department if you develop worsening bleeding, abdominal pains, or for other new and concerning symptoms.

## 2022-11-22 NOTE — ED Provider Notes (Signed)
  [place] EM[facility]GENCY DEPARTMENT AT [facility] Provider Note   CSN: [id number] Arrival date & time: 11/21/22  2258     History  Chief Complaint  Patient presents with   Blood In Stools    Haley Cunningham is a 42 y.o. female.  Patient is a 42 year old female with no significant past medical history.  Patient presenting with rectal bleeding.  She has had 2 bowel movements over the past 24 hours that were somewhat hard.  Each time, she noticed some blood in the toilet and on the toilet paper.  The blood was bright red.  She does describe some rectal pain, but denies any abdominal pain.  No fevers or chills.  She has never had an episode like this before.  The history is provided by the patient.       Home Medications Prior to Admission medications   Medication Sig Start Date End Date Taking? Authorizing Provider  methocarbamol (ROBAXIN) 500 MG tablet Take 1 tablet (500 mg total) by mouth 2 (two) times daily. 11/16/22   Garrison, [place] N, FNP  MOUNJARO 2.5 MG/0.5ML Pen Inject into the skin. 03/03/21   [provider]  naproxen (NAPROSYN) 500 MG tablet Take 1 tablet (500 mg total) by mouth 2 (two) times daily. 11/16/22   Garrison, [place] N, FNP  ferrous fumarate (HEMOCYTE - 106 MG FE) 325 (106 FE) MG TABS tablet Take 1 tablet by mouth.  11/05/19  [provider]  pantoprazole (PROTONIX) 20 MG tablet Take 1 tablet (20 mg total) by mouth 2 (two) times daily for 30 days. 09/05/18 11/05/19  LampteyBritta Mccreedy, MD      Allergies    Patient has no known allergies.    Review of Systems   Review of Systems  All other systems reviewed and are negative.   Physical Exam Updated Vital Signs BP 122/68   Pulse 81   Temp 97.8 F (36.6 C)   Resp 12   Ht 5\' 8"  (1.727 m)   Wt 104.3 kg   LMP 11/08/2022 (Approximate)   SpO2 100%   BMI 34.97 kg/m  Physical Exam Vitals and nursing note reviewed.  Constitutional:      Appearance: Normal appearance.  Pulmonary:      Effort: Pulmonary effort is normal.  Genitourinary:    Comments: Rectal examination fails to reveal any hemorrhoids or obvious fissures.  There are no palpable masses on digital exam. Skin:    General: Skin is warm and dry.  Neurological:     Mental Status: She is alert.     ED Results / Procedures / Treatments   Labs (all labs ordered are listed, but only abnormal results are displayed) Labs Reviewed - No data to display  EKG None  Radiology No results found.  Procedures Procedures    Medications Ordered in ED Medications - No data to display  ED Course/ Medical Decision Making/ A&P  Patient presenting with bright red blood per rectum as described in the HPI.  I highly suspect a fissure given the history of passing hard stools.  Rectal examination shows no obvious abnormalities.  At this point, I will recommend fiber supplementation, stool softeners, and have her follow-up with gastroenterology if symptoms persist.  Final Clinical Impression(s) / ED Diagnoses Final diagnoses:  None    Rx / DC Orders ED Discharge Orders     None         Geoffery Lyons, MD 11/22/22 0210

## 2023-02-23 ENCOUNTER — Encounter (HOSPITAL_BASED_OUTPATIENT_CLINIC_OR_DEPARTMENT_OTHER): Payer: Self-pay | Admitting: Emergency Medicine

## 2023-02-23 ENCOUNTER — Emergency Department (HOSPITAL_BASED_OUTPATIENT_CLINIC_OR_DEPARTMENT_OTHER): Payer: Self-pay

## 2023-02-23 ENCOUNTER — Emergency Department (HOSPITAL_BASED_OUTPATIENT_CLINIC_OR_DEPARTMENT_OTHER)
Admission: EM | Admit: 2023-02-23 | Discharge: 2023-02-23 | Disposition: A | Discharge status: Home / Self Care | Payer: Self-pay | Attending: Emergency Medicine | Admitting: Emergency Medicine

## 2023-02-23 DIAGNOSIS — Z3A01 Less than 8 weeks gestation of pregnancy: Secondary | ICD-10-CM | POA: Insufficient documentation

## 2023-02-23 DIAGNOSIS — O219 Vomiting of pregnancy, unspecified: Secondary | ICD-10-CM | POA: Insufficient documentation

## 2023-02-23 LAB — HCG, QUANTITATIVE, PREGNANCY: hCG, Beta Chain, Quant, S: 2386 m[IU]/mL — ABNORMAL HIGH (ref <5)

## 2023-02-23 MED ORDER — PRENATAL COMPLETE 14-0.4 MG PO TABS: 1.0000 | ORAL_TABLET | Freq: Every day | ORAL | 0 refills | Status: AC

## 2023-02-23 NOTE — ED Notes (Signed)
 RN reviewed discharge instructions with pt. Pt verbalized understanding and had no further questions. VSS upon discharge.  

## 2023-02-23 NOTE — ED Provider Notes (Signed)
[place] EMERGENCY DEPARTMENT AT [facility] Provider Note   CSN: [id number] Arrival date & time: 02/23/23  1422    History  Chief Complaint  Patient presents with   Abdominal Pain    Haley Cunningham is a 42 y.o. female G2, P1 here for evaluation of lower abdominal cramping.  Has been off and on for the last few weeks.  LMP 01/11/2023.  Took a home pregnancy test which is positive.  She tried to get an outpatient ultrasound to have one of the ultrasound clinics however they were not able to see anything they recommend she come here to the emergency department to rule out ectopic.  No dysuria, hematuria, vaginal bleeding, back pain, concerns for STDs.  He is not on a prenatal  HPI     Home Medications Prior to Admission medications   Medication Sig Start Date End Date Taking? Authorizing Provider  Prenatal Vit-Fe Fumarate-FA (PRENATAL COMPLETE) 14-0.4 MG TABS Take 1 tablet by mouth daily. 02/23/23  Yes Aundraya Dripps A, PA-C  ferrous fumarate (HEMOCYTE - 106 MG FE) 325 (106 FE) MG TABS tablet Take 1 tablet by mouth.  11/05/19  [provider]  pantoprazole (PROTONIX) 20 MG tablet Take 1 tablet (20 mg total) by mouth 2 (two) times daily for 30 days. 09/05/18 11/05/19  LampteyBritta Mccreedy, MD      Allergies    Patient has no known allergies.    Review of Systems   Review of Systems  Constitutional: Negative.   HENT: Negative.    Respiratory: Negative.    Cardiovascular: Negative.   Gastrointestinal:  Positive for abdominal pain and nausea. Negative for abdominal distention, anal bleeding, blood in stool, constipation, diarrhea, rectal pain and vomiting.  Genitourinary: Negative.   Musculoskeletal: Negative.   Skin: Negative.   Neurological: Negative.   All other systems reviewed and are negative.   Physical Exam Updated Vital Signs BP 109/78   Pulse 88   Temp 98.5 F (36.9 C) (Oral)   Resp 16   LMP 01/11/2023   SpO2 100%  Physical Exam Vitals and  nursing note reviewed.  Constitutional:      General: She is not in acute distress.    Appearance: She is well-developed. She is not ill-appearing, toxic-appearing or diaphoretic.  HENT:     Head: Atraumatic.  Eyes:     Pupils: Pupils are equal, round, and reactive to light.  Cardiovascular:     Rate and Rhythm: Normal rate.     Heart sounds: Normal heart sounds.  Pulmonary:     Effort: Pulmonary effort is normal. No respiratory distress.     Breath sounds: Normal breath sounds.  Abdominal:     General: Bowel sounds are normal. There is no distension.     Palpations: Abdomen is soft.     Tenderness: There is no abdominal tenderness. There is no right CVA tenderness or guarding. Negative signs include Murphy's sign and McBurney's sign.  Genitourinary:    Comments: Pt deferred Musculoskeletal:        General: Normal range of motion.     Cervical back: Normal range of motion.  Skin:    General: Skin is warm and dry.  Neurological:     General: No focal deficit present.     Mental Status: She is alert.  Psychiatric:        Mood and Affect: Mood normal.    ED Results / Procedures / Treatments   Labs (all labs ordered are listed, but  only abnormal results are displayed) Labs Reviewed  HCG, QUANTITATIVE, PREGNANCY - Abnormal; Notable for the following components:      Result Value   hCG, Beta Chain, Quant, S 2,386 (*)    All other components within normal limits    EKG None  Radiology US OB Comp < 14 Wks  Result Date: 02/23/2023 CLINICAL DATA:  Pain, pregnant EXAM: OBSTETRIC <14 WK [place] AND TRANSVAGINAL OB US TECHNIQUE: Both transabdominal and transvaginal ultrasound examinations were performed for complete evaluation of the gestation as well as the maternal uterus, adnexal regions, and pelvic cul-de-sac. Transvaginal technique was performed to assess early pregnancy. COMPARISON:  None Available. FINDINGS: Intrauterine gestational sac: Single Yolk sac:  Visualized. Embryo:   Not Visualized. Cardiac Activity: Not Visualized. Heart Rate: Not visualized. MSD: 5.0 mm   5 w   2  d Subchorionic hemorrhage:  None visualized. Maternal uterus/adnexae: Small simple, benign functional cyst of the left ovary requiring no further follow-up or characterization. IMPRESSION: Single intrauterine gestation with visualized yolk sac although without fetal pole or cardiac activity. Early pregnancy of uncertain viability. Recommend serial beta hCG and follow-up ultrasound in 7-14 days to assess for continued development and viability. Electronically Signed   By: Jearld Lesch M.D.   On: 02/23/2023 18:40   US OB Transvaginal  Result Date: 02/23/2023 CLINICAL DATA:  Pain, pregnant EXAM: OBSTETRIC <14 WK [place] AND TRANSVAGINAL OB US TECHNIQUE: Both transabdominal and transvaginal ultrasound examinations were performed for complete evaluation of the gestation as well as the maternal uterus, adnexal regions, and pelvic cul-de-sac. Transvaginal technique was performed to assess early pregnancy. COMPARISON:  None Available. FINDINGS: Intrauterine gestational sac: Single Yolk sac:  Visualized. Embryo:  Not Visualized. Cardiac Activity: Not Visualized. Heart Rate: Not visualized. MSD: 5.0 mm   5 w   2  d Subchorionic hemorrhage:  None visualized. Maternal uterus/adnexae: Small simple, benign functional cyst of the left ovary requiring no further follow-up or characterization. IMPRESSION: Single intrauterine gestation with visualized yolk sac although without fetal pole or cardiac activity. Early pregnancy of uncertain viability. Recommend serial beta hCG and follow-up ultrasound in 7-14 days to assess for continued development and viability. Electronically Signed   By: Jearld Lesch M.D.   On: 02/23/2023 18:40    Procedures Procedures    Medications Ordered in ED Medications - No data to display  ED Course/ Medical Decision Making/ A&P   42 year old here for evaluation of lower abd cramping and concern  for pregnancy.  LMP 01/11/2023.  She is a G2, P1.  No urinary symptoms.  No vaginal discharge or concern for STD.  No change in bowel movements, fever.  She has had some nausea that vomiting.  She took a home pregnancy test which was positive.  She was seen outpatient today for a boutique ultrasound however they were unable to visualize anything, given her reported cramping they recommended she come to the emergency department to rule out ectopic.  Her heart and lungs are clear.  Abdomen soft, nontender. Declined GU exam  Labs and imaging personally viewed and interpreted:  Hcg 2386 Ultrasound with 5-week 2-day single IUP without documented cardiac activity.  Rec serial hCGs outpatient follow-up  Discussed results with patient, significant other room she gave permission to discuss workup with.  She states she has OB/GYN she can make an appointment with.  I encouraged starting on a prenatal as well as antiemetic  The patient has been appropriately medically screened and/or stabilized in the ED. I  have low suspicion for any other emergent medical condition which would require further screening, evaluation or treatment in the ED or require inpatient management.  Patient is hemodynamically stable and in no acute distress.  Patient able to ambulate in department prior to ED.  Evaluation does not show acute pathology that would require ongoing or additional emergent interventions while in the emergency department or further inpatient treatment.  I have discussed the diagnosis with the patient and answered all questions.  Pain is been managed while in the emergency department and patient has no further complaints prior to discharge.  Patient is comfortable with plan discussed in room and is stable for discharge at this time.  I have discussed strict return precautions for returning to the emergency department.  Patient was encouraged to follow-up with PCP/specialist refer to at discharge.                                  Medical Decision Making Amount and/or Complexity of Data Reviewed Independent Historian: friend External Data Reviewed: labs, radiology and notes. Labs: ordered. Decision-making details documented in ED Course. Radiology: ordered and independent interpretation performed. Decision-making details documented in ED Course.  Risk OTC drugs. Prescription drug management. Parenteral controlled substances. Decision regarding hospitalization. Diagnosis or treatment significantly limited by social determinants of health.         Final Clinical Impression(s) / ED Diagnoses Final diagnoses:  Less than [redacted] weeks gestation of pregnancy  Nausea and vomiting in pregnancy    Rx / DC Orders ED Discharge Orders          Ordered    Prenatal Vit-Fe Fumarate-FA (PRENATAL COMPLETE) 14-0.4 MG TABS  Daily        02/23/23 1950              Danyle Boening A, PA-C 02/23/23 Bishop Limbo, MD 02/25/23 1153

## 2023-02-23 NOTE — ED Triage Notes (Signed)
Reports abd pain "off and on" Patient reports +pregnancy test. Reports she had [place] today and no fetus was observed. States she was sent to "check her tubes and find out how far along she is"

## 2023-02-23 NOTE — Discharge Instructions (Addendum)
May take Unisom 12.5 mg daily, start taking at night as well as Vitamin B6 10-25 mg twice daily  Make an appointment with an OB/GYN to establish care, start taking a prenatal

## 2023-03-11 ENCOUNTER — Encounter (HOSPITAL_BASED_OUTPATIENT_CLINIC_OR_DEPARTMENT_OTHER): Payer: Self-pay

## 2023-03-11 ENCOUNTER — Other Ambulatory Visit: Payer: Self-pay

## 2023-03-11 ENCOUNTER — Emergency Department (HOSPITAL_BASED_OUTPATIENT_CLINIC_OR_DEPARTMENT_OTHER)
Admission: EM | Admit: 2023-03-11 | Discharge: 2023-03-12 | Disposition: A | Discharge status: Home / Self Care | Payer: Self-pay | Attending: Emergency Medicine | Admitting: Emergency Medicine

## 2023-03-11 DIAGNOSIS — O034 Incomplete spontaneous abortion without complication: Secondary | ICD-10-CM | POA: Insufficient documentation

## 2023-03-11 DIAGNOSIS — Z3A01 Less than 8 weeks gestation of pregnancy: Secondary | ICD-10-CM | POA: Insufficient documentation

## 2023-03-11 DIAGNOSIS — O208 Other hemorrhage in early pregnancy: Secondary | ICD-10-CM | POA: Insufficient documentation

## 2023-03-11 DIAGNOSIS — E119 Type 2 diabetes mellitus without complications: Secondary | ICD-10-CM | POA: Insufficient documentation

## 2023-03-11 LAB — CBC
HCT: 39.1 % (ref 36.0–46.0)
Hemoglobin: 12.9 g/dL (ref 12.0–15.0)
MCH: 27.2 pg (ref 26.0–34.0)
MCHC: 33 g/dL (ref 30.0–36.0)
MCV: 82.5 fL (ref 80.0–100.0)
Platelets: 419 10*3/uL — ABNORMAL HIGH (ref 150–400)
RBC: 4.74 MIL/uL (ref 3.87–5.11)
RDW: 14.2 % (ref 11.5–15.5)
WBC: 10.3 10*3/uL (ref 4.0–10.5)
nRBC: 0 % (ref 0.0–0.2)

## 2023-03-11 LAB — HCG, QUANTITATIVE, PREGNANCY: hCG, Beta Chain, Quant, S: 3958 m[IU]/mL — ABNORMAL HIGH (ref <5)

## 2023-03-11 NOTE — ED Triage Notes (Signed)
Pt reports heavy vaginal bleeding and abdominal pain today. Pt states that she is [redacted] weeks pregnant.

## 2023-03-12 ENCOUNTER — Emergency Department (HOSPITAL_BASED_OUTPATIENT_CLINIC_OR_DEPARTMENT_OTHER): Payer: Self-pay

## 2023-03-12 LAB — BASIC METABOLIC PANEL
Anion gap: 14 (ref 5–15)
BUN: 14 mg/dL (ref 6–20)
CO2: 20 mmol/L — ABNORMAL LOW (ref 22–32)
Calcium: 10 mg/dL (ref 8.9–10.3)
Chloride: 101 mmol/L (ref 98–111)
Creatinine, Ser: 0.74 mg/dL (ref 0.44–1.00)
GFR, Estimated: >60 mL/min (ref >60)
Glucose, Bld: 180 mg/dL — ABNORMAL HIGH (ref 70–99)
Potassium: 3.9 mmol/L (ref 3.5–5.1)
Sodium: 135 mmol/L (ref 135–145)

## 2023-03-12 LAB — WET PREP, GENITAL
Clue Cells Wet Prep HPF POC: NONE SEEN
Sperm: NONE SEEN
Trich, Wet Prep: NONE SEEN
WBC, Wet Prep HPF POC: <10 (ref <10)
Yeast Wet Prep HPF POC: NONE SEEN

## 2023-03-12 LAB — ABO/RH: ABO/RH(D): B POS

## 2023-03-12 MED ORDER — HYDROMORPHONE HCL 1 MG/ML IJ SOLN
0.5000 mg | Freq: Once | INTRAMUSCULAR | Status: AC
  Administered 2023-03-12: 0.5 mg via INTRAVENOUS
  Filled 2023-03-12: qty 1

## 2023-03-12 MED ORDER — ONDANSETRON 4 MG PO TBDP: 4.0000 mg | ORAL_TABLET | Freq: Three times a day (TID) | ORAL | 0 refills | Status: AC | PRN

## 2023-03-12 MED ORDER — MISOPROSTOL 200 MCG PO TABS: 800.0000 ug | ORAL_TABLET | Freq: Once | ORAL | 0 refills | Status: AC

## 2023-03-12 MED ORDER — MELOXICAM 7.5 MG PO TABS: 7.5000 mg | ORAL_TABLET | Freq: Two times a day (BID) | ORAL | 0 refills | Status: AC | PRN

## 2023-03-12 NOTE — ED Notes (Signed)
Patient transported to Ultrasound via wheelchair.

## 2023-03-12 NOTE — ED Notes (Signed)
Chaperoned Dr. Burley Saver for pelvic exam.

## 2023-03-12 NOTE — ED Provider Notes (Signed)
[place] EMERGENCY DEPARTMENT AT [facility] Provider Note  CSN: [id number] Arrival date & time: 03/11/23 2041  Chief Complaint(s) Vaginal Bleeding  HPI Haley Cunningham is a 42 y.o. female here for several days of vaginal bleeding initially spotting that became more profound today.  Patient is also having lower abdominal cramping.  Reports that last menstrual cycle was September 13.  Currently approximately 6 to [redacted] weeks pregnant.  History of prior miscarriage 2 years ago.   Vaginal Bleeding   Past Medical History Past Medical History:  Diagnosis Date   Diabetes mellitus without complication (HCC)    There are no problems to display for this patient.  Home Medication(s) Prior to Admission medications   Medication Sig Start Date End Date Taking? Authorizing Provider  meloxicam (MOBIC) 7.5 MG tablet Take 1 tablet (7.5 mg total) by mouth 2 (two) times daily as needed for up to 7 days for pain. 03/12/23 03/19/23 Yes Rashidi Loh, Amadeo Garnet, MD  misoprostol (CYTOTEC) 200 MCG tablet Place 4 tablets (800 mcg total) vaginally once for 1 dose. 03/12/23 03/12/23 Yes Kolton Kienle, Amadeo Garnet, MD  ondansetron (ZOFRAN-ODT) 4 MG disintegrating tablet Take 1 tablet (4 mg total) by mouth every 8 (eight) hours as needed for up to 3 days. 03/12/23 03/15/23 Yes Sherida Dobkins, Amadeo Garnet, MD  Prenatal Vit-Fe Fumarate-FA (PRENATAL COMPLETE) 14-0.4 MG TABS Take 1 tablet by mouth daily. 02/23/23   Henderly, Britni A, PA-C  ferrous fumarate (HEMOCYTE - 106 MG FE) 325 (106 FE) MG TABS tablet Take 1 tablet by mouth.  11/05/19  [provider]  pantoprazole (PROTONIX) 20 MG tablet Take 1 tablet (20 mg total) by mouth 2 (two) times daily for 30 days. 09/05/18 11/05/19  LampteyBritta Mccreedy, MD                                                                                                                                    Allergies Patient has no known allergies.  Review of Systems Review of Systems   Genitourinary:  Positive for vaginal bleeding.   As noted in HPI  Physical Exam Vital Signs  I have reviewed the triage vital signs BP 109/65   Pulse 98   Temp 98.4 F (36.9 C)   Resp 16   Ht 5\' 8"  (1.727 m)   Wt 104.3 kg   LMP 01/11/2023   SpO2 98%   BMI 34.97 kg/m   Physical Exam Vitals reviewed. Exam conducted with a chaperone present.  Constitutional:      General: She is not in acute distress.    Appearance: She is well-developed. She is obese. She is not diaphoretic.  HENT:     Head: Normocephalic and atraumatic.     Right Ear: External ear normal.     Left Ear: External ear normal.     Nose: Nose normal.  Eyes:     General: No scleral icterus.    Conjunctiva/sclera:  Conjunctivae normal.  Neck:     Trachea: Phonation normal.  Cardiovascular:     Rate and Rhythm: Normal rate and regular rhythm.  Pulmonary:     Effort: Pulmonary effort is normal. No respiratory distress.     Breath sounds: No stridor.  Abdominal:     General: There is no distension.     Tenderness: There is abdominal tenderness in the suprapubic area.  Genitourinary:    Cervix: Cervical bleeding present.     Comments: Product of conception coming from [place] Musculoskeletal:        General: Normal range of motion.     Cervical back: Normal range of motion.  Neurological:     Mental Status: She is alert and oriented to person, place, and time.  Psychiatric:        Behavior: Behavior normal.     ED Results and Treatments Labs (all labs ordered are listed, but only abnormal results are displayed) Labs Reviewed  CBC - Abnormal; Notable for the following components:      Result Value   Platelets 419 (*)    All other components within normal limits  HCG, QUANTITATIVE, PREGNANCY - Abnormal; Notable for the following components:   hCG, Beta Chain, Quant, S 3,958 (*)    All other components within normal limits  BASIC METABOLIC PANEL - Abnormal; Notable for the following components:   CO2  20 (*)    Glucose, Bld 180 (*)    All other components within normal limits  WET PREP, GENITAL  PREGNANCY, URINE  ABO/RH  GC/CHLAMYDIA PROBE AMP ([place]) NOT AT [facility]                                                                                                                         EKG  EKG Interpretation Date/Time:    Ventricular Rate:    PR Interval:    QRS Duration:    QT Interval:    QTC Calculation:   R Axis:      Text Interpretation:         Radiology US OB Comp < 14 Wks  Result Date: 03/12/2023 CLINICAL DATA:  First trimester vaginal bleeding. EXAM: OBSTETRIC <14 WK [place] AND TRANSVAGINAL OB US TECHNIQUE: Both transabdominal and transvaginal ultrasound examinations were performed for complete evaluation of the gestation as well as the maternal uterus, adnexal regions, and pelvic cul-de-sac. Transvaginal technique was performed to assess early pregnancy. COMPARISON:  Earlier study from 02/23/2023, showing a 5 weeks 2 days gestational sac with yolk sac but no visible fetal pole. FINDINGS: Intrauterine gestational sac: Visualized, but in the cervical canal. Yolk sac:  Visualized. Embryo:  Visualized. Cardiac Activity: Not Visualized. Heart Rate: N/a. MSD: Embryo visible, not measured. CRL:  3.3 mm   6 w   0 d Subchorionic hemorrhage:  None visualized. Maternal uterus/adnexae: Anteverted uterus measures 11.9 x 5.3 x 6.6 cm. No wall mass is seen. Endometrial complex is heterogeneous measuring 15 mm with small amount  of fluid or blood in the cavity. Left ovary measures 13.2 cm3 volume and contains a 3.2 cm simple cyst. There is preservation of color flow to the left ovary. No follow-up imaging is recommended. Right ovary is not visualized. No free fluid or adnexal mass is evident. IMPRESSION: 1. Abortion in progress or missed abortion, with gestational sac in the cervical canal containing an embryo measuring 6 weeks. Previously the gestational sac was in the distal cavity. 2.  Heterogeneous endometrial complex with small amount of fluid or blood in the cavity. 3. 3 cm simple cyst left ovary with nonvisualization of the right ovary. Electronically Signed   By: Almira Bar M.D.   On: 03/12/2023 04:00    Medications Ordered in ED Medications  HYDROmorphone (DILAUDID) injection 0.5 mg (0.5 mg Intravenous Given 03/12/23 0243)   Procedures Procedures  (including critical care time) Medical Decision Making / ED Course   Medical Decision Making Amount and/or Complexity of Data Reviewed Labs: ordered. Decision-making details documented in ED Course. Radiology: ordered and independent interpretation performed. Decision-making details documented in ED Course.  Risk Prescription drug management. Parenteral controlled substances.    Pelvic cramping and vaginal bleeding concerning for incomplete spontaneous abortion Hemoglobin stable Ultrasound confirmed incomplete abortion with intrauterine gestational sac.  No evidence of ectopic pregnancy. B+ blood type -no RhoGAM necessary. Patient given Dilaudid for pain while awaiting results.  Spoke with Dr. Debroah Loop from [facility] who agreed with giving patient Cytotec and have her follow-up in clinic in 1 week.    Final Clinical Impression(s) / ED Diagnoses Final diagnoses:  Incomplete spontaneous abortion   The patient appears reasonably screened and/or stabilized for discharge and I doubt any other medical condition or other [facility] requiring further screening, evaluation, or treatment in the ED at this time. I have discussed the findings, Dx and Tx plan with the patient/family who expressed understanding and agree(s) with the plan. Discharge instructions discussed at length. The patient/family was given strict return precautions who verbalized understanding of the instructions. No further questions at time of discharge.  Disposition: Discharge  Condition: Good  ED Discharge Orders          Ordered    misoprostol (CYTOTEC)  200 MCG tablet   Once        03/12/23 0538    meloxicam (MOBIC) 7.5 MG tablet  2 times daily PRN        03/12/23 0538    ondansetron (ZOFRAN-ODT) 4 MG disintegrating tablet  Every 8 hours PRN        03/12/23 0538            Follow Up: [facility] for [facility] [address] [place] [phone] [phone] Schedule an appointment as soon as possible for a visit in 1 week to schedule follow up in 1-2 weeks    This chart was dictated using voice recognition software.  Despite best efforts to proofread,  errors can occur which can change the documentation meaning.    Nira Conn, MD 03/12/23 [phone]

## 2023-03-13 LAB — GC/CHLAMYDIA PROBE AMP (~~LOC~~) NOT AT ARMC
Chlamydia: NEGATIVE
Comment: NEGATIVE
Comment: NORMAL
Neisseria Gonorrhea: NEGATIVE

## 2023-03-22 ENCOUNTER — Ambulatory Visit (INDEPENDENT_AMBULATORY_CARE_PROVIDER_SITE_OTHER): Payer: Self-pay | Admitting: Obstetrics and Gynecology

## 2023-03-22 ENCOUNTER — Other Ambulatory Visit: Payer: Self-pay | Admitting: Obstetrics and Gynecology

## 2023-03-22 ENCOUNTER — Encounter: Payer: Self-pay | Admitting: Obstetrics and Gynecology

## 2023-03-22 ENCOUNTER — Other Ambulatory Visit: Payer: Self-pay

## 2023-03-22 VITALS — BP 128/84 | HR 81 | Wt 239.2 lb

## 2023-03-22 DIAGNOSIS — O039 Complete or unspecified spontaneous abortion without complication: Secondary | ICD-10-CM

## 2023-03-22 LAB — BETA HCG QUANT (REF LAB): hCG Quant: 51 m[IU]/mL

## 2023-03-22 MED ORDER — ESCITALOPRAM OXALATE 5 MG PO TABS: 5.0000 mg | ORAL_TABLET | Freq: Every day | ORAL | 0 refills | Status: AC

## 2023-03-22 NOTE — Progress Notes (Unsigned)
   GYNECOLOGY PROGRESS NOTE  History:  41 y.o. G1P0 presents to [facility], no bleeding or pain. Reports nausea, worsening anxiety and depression  Went to the emergency department on 11/12 for vaginal bleeding, reports being around 6-[redacted] weeks pregnant. Given cytotec and reports bleeding that day.   Health Maintenance Due  Topic Date Due   HIV Screening  Never done   Hepatitis C Screening  Never done   DTaP/Tdap/Td (1 - Tdap) Never done   Cervical Cancer Screening (HPV/Pap Cotest)  Never done   INFLUENZA VACCINE  Never done   COVID-19 Vaccine (2 - 2023-24 season) 12/30/2022     Review of Systems:  Pertinent items are noted in HPI.   Objective:  Physical Exam Blood pressure 128/84, pulse 81, weight 239 lb 4 oz (108.5 kg), last menstrual period 01/11/2023. VS reviewed, nursing note reviewed,  Constitutional: well developed, well nourished, no distress HEENT: normocephalic CV: normal rate Pulm/chest wall: normal effort Abdomen: soft Neuro: alert and oriented  Skin: warm, dry Psych: affect normal Pelvic exam: deferred  Assessment & Plan:   1. Miscarriage Was Given cytotec, ultrasound revealed Intrauterine gestational sac: Visualized, but in the cervical canal. Yolk sac: Visualized. Embryo: Visualized. Cardiac Activity: Not Visualized. Heart Rate: N/a. MSD: Embryo visible, not measured. CRL: 3.3 mm 6 w 0 d. Blood work and blood type obtained in hospital  Reassured patient that miscarriage is common with ~1/4 of women experiencing it in their lifetime. Reassured patient that there is nothing she did or did not do to cause this. Reviewed most common reason is presumed to be genetic abnormalities that allow a pregnancy to start but not continue past an early stage, but realistically we do not know the cause in most cases.  Will trend HCG Reports significant anxiety and depression. Discussed counseling services and medication, would like to try both. Discussed medication  options and side effects, will trial lexapro. Discussed starting low dose and movement to increase if needed. Rx sent to pharmacy, referral placed for counseling.  - escitalopram (LEXAPRO) 5 MG tablet; Take 1 tablet (5 mg total) by mouth daily.  Dispense: 30 tablet; Refill: 0 - [facility]  Return in 4 weeks for annual with mood follow up, schedule w/ Scheryl Darter, FNP

## 2023-03-27 ENCOUNTER — Telehealth: Payer: Self-pay

## 2023-03-27 DIAGNOSIS — O039 Complete or unspecified spontaneous abortion without complication: Secondary | ICD-10-CM

## 2023-03-27 NOTE — Telephone Encounter (Addendum)
-----   Message from [facility] sent at 03/27/2023  8:34 AM EST ----- Can we get this patient scheduled for a lab draw, beta hcg one day next week?  Notified pt providers recommendation and we are trending her levels to zero.  Pt agreed to coming in on 04/01/23 at 1000 for non beta.    Ioana Louks,RN  03/27/23

## 2023-04-01 ENCOUNTER — Other Ambulatory Visit: Payer: Self-pay

## 2023-04-01 DIAGNOSIS — O039 Complete or unspecified spontaneous abortion without complication: Secondary | ICD-10-CM

## 2023-04-02 LAB — BETA HCG QUANT (REF LAB): hCG Quant: 10 m[IU]/mL

## 2023-04-03 LAB — BETA HCG QUANT (REF LAB)
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
## 2023-07-08 IMAGING — US US OB < 14 WEEKS - US OB TV
1 series · 14 of 28 positions shown · non-contrast
Comparison: None.

CLINICAL DATA: Vaginal bleeding with positive pregnancy test

EXAM:
OBSTETRIC <14 WK US AND TRANSVAGINAL OB US
TECHNIQUE: Both transabdominal and transvaginal ultrasound examinations were
performed for complete evaluation of the gestation as well as the
maternal uterus, adnexal regions, and pelvic cul-de-sac.
Transvaginal technique was performed to assess early pregnancy.

[Series 1: us ob less than 14 weeks with ob transvaginal · 14 of 81 slices shown]
[im 3/81]
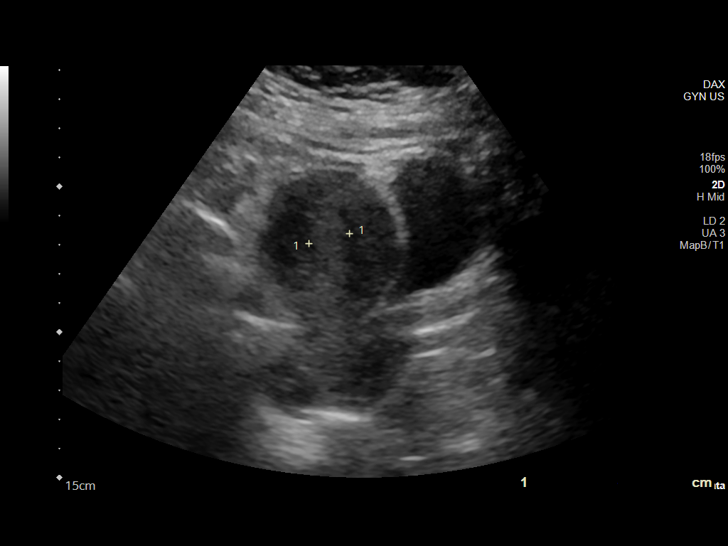
[im 9/81]
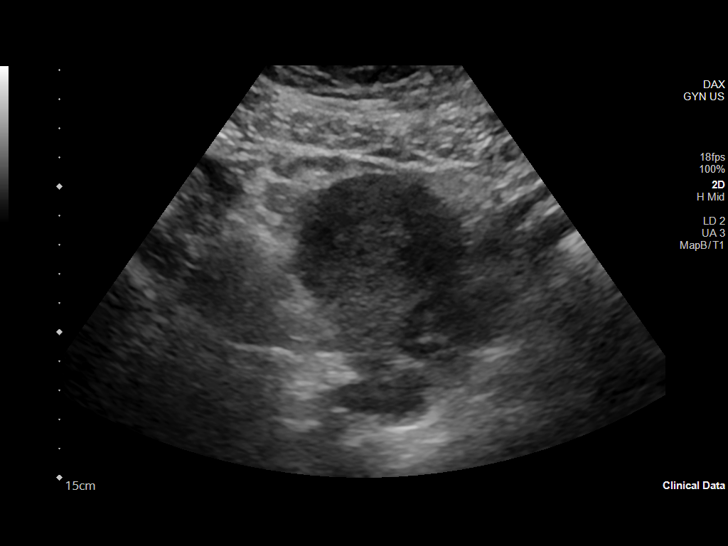
[im 15/81]
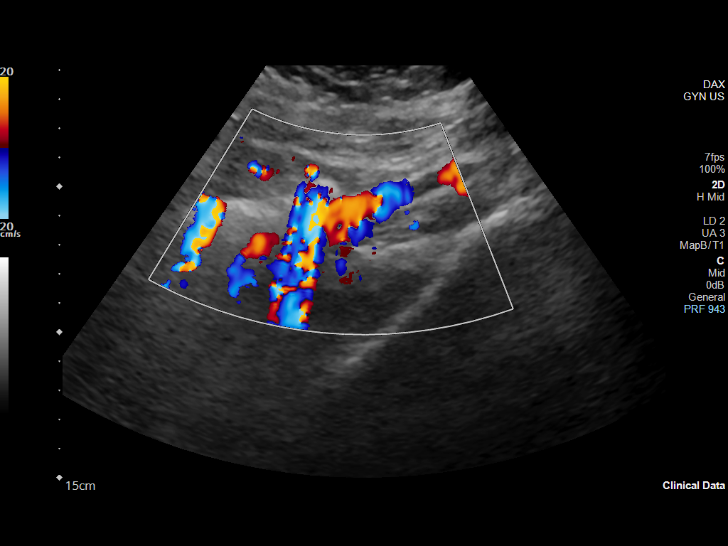
[im 21/81]
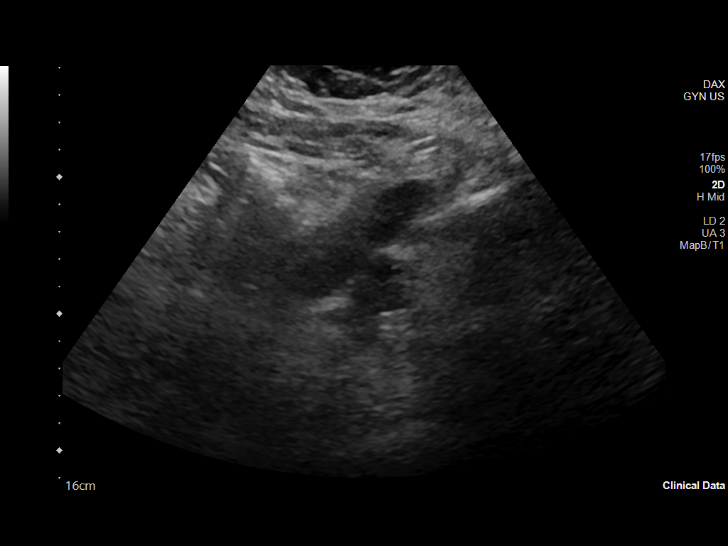
[im 27/81]
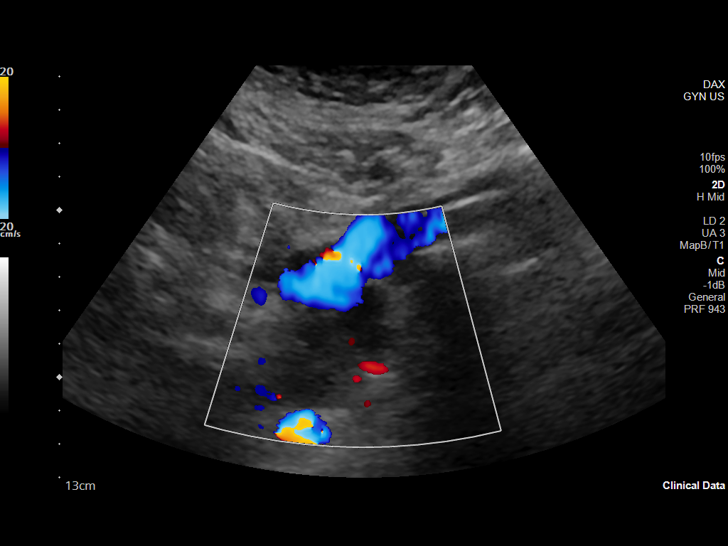
[im 33/81]
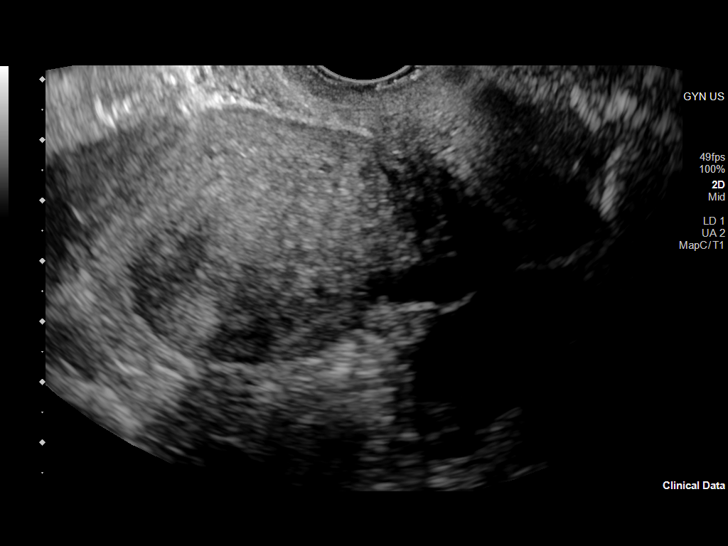
[im 39/81]
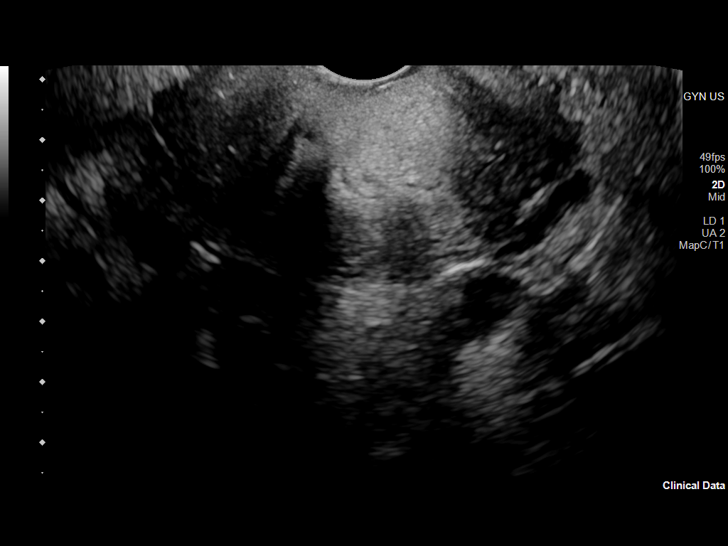
[im 45/81]
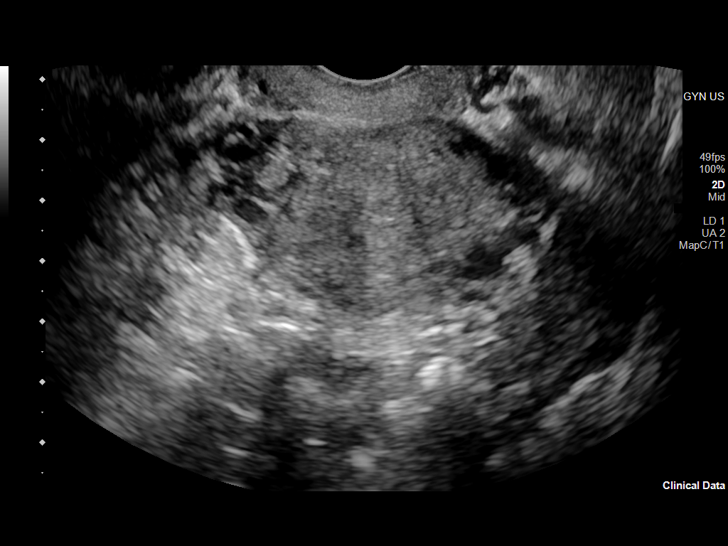
[im 51/81]
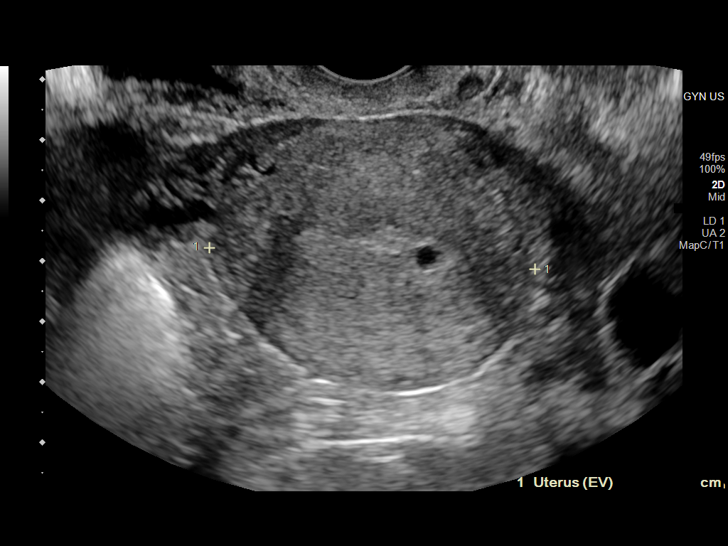
[im 57/81]
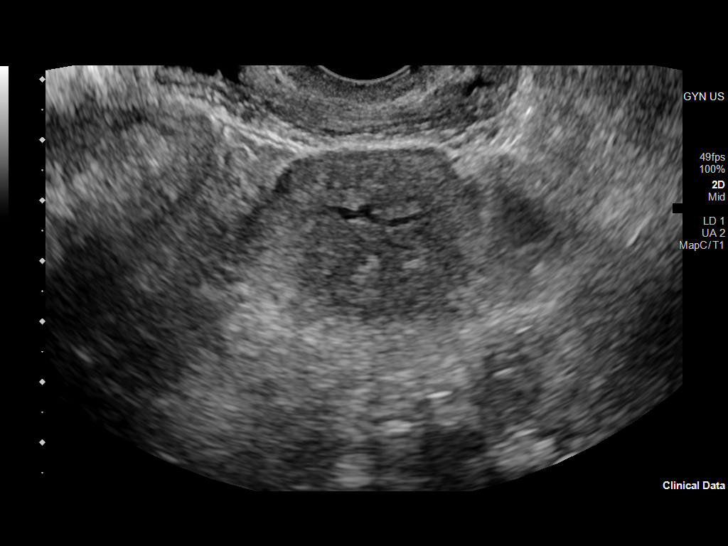
[im 63/81]
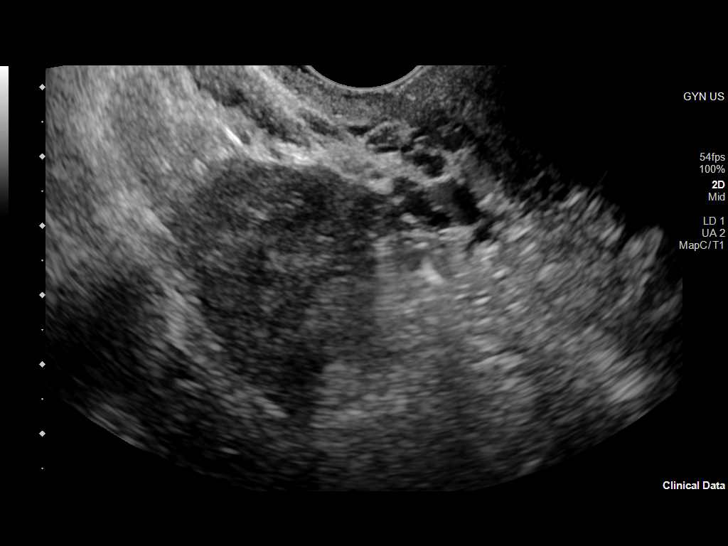
[im 69/81]
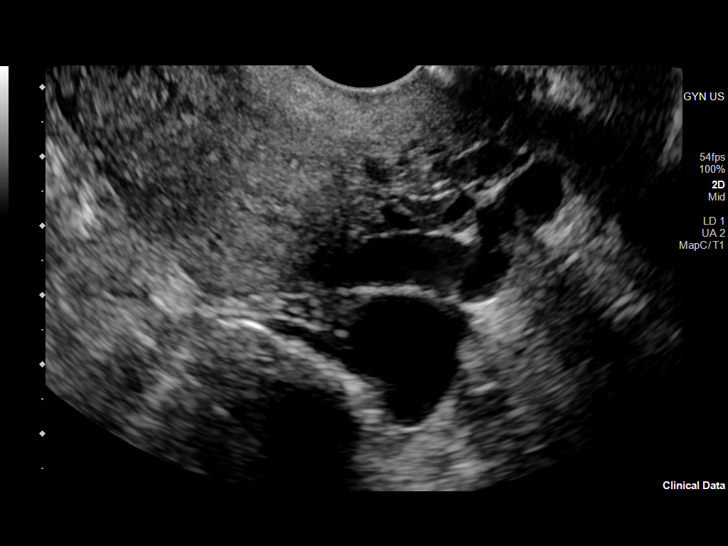
[im 75/81]
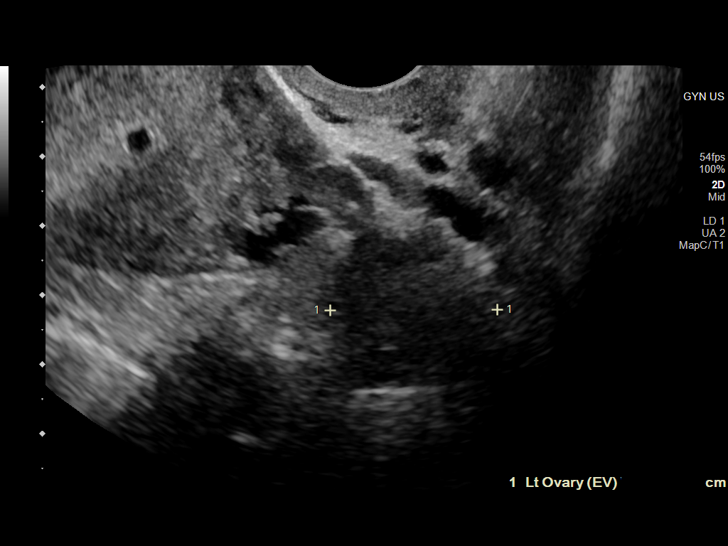
[im 81/81]
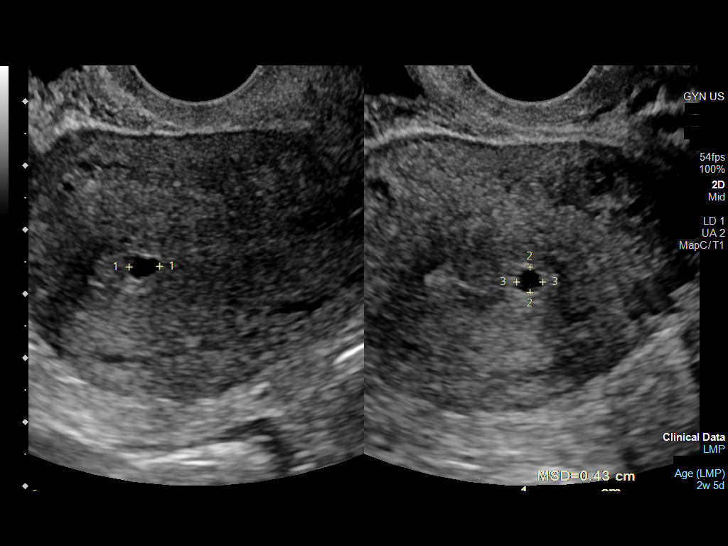

[14 of 28 positions shown; findings below may reference images not displayed]

FINDINGS: Intrauterine gestational sac: Present

Yolk sac:  Absent

Embryo:  Absent

MSD: 4.3 mm   5 w   1 d

Subchorionic hemorrhage:  None visualized.

Maternal uterus/adnexae: Left ovary demonstrates a 1.9 cm simple
cyst. Right ovary is within normal limits.
IMPRESSION: Probable early intrauterine gestational sac, but no yolk sac, fetal
pole, or cardiac activity yet visualized. Recommend follow-up
quantitative B-HCG levels and follow-up US in 14 days to assess
viability. This recommendation follows SRU consensus guidelines:
Diagnostic Criteria for Nonviable Pregnancy Early in the First
Trimester. N Engl J Med 2387; [DATE].

## 2023-07-28 IMAGING — US US OB < 14 WEEKS - US OB TV
1 series · 13 of 28 positions shown · non-contrast
Comparison: Ultrasound dated 03/16/2021.

CLINICAL DATA: Vaginal bleeding. LMP: 02/25/2021 corresponding to
an estimated gestational age of 5 weeks, 4 days.

EXAM:
OBSTETRIC <14 WK US AND TRANSVAGINAL OB US
TECHNIQUE: Both transabdominal and transvaginal ultrasound examinations were
performed for complete evaluation of the gestation as well as the
maternal uterus, adnexal regions, and pelvic cul-de-sac.
Transvaginal technique was performed to assess early pregnancy.

[Series 1: us ob less than 14 weeks with ob transvaginal · 13 of 55 slices shown]
[im 3/55]
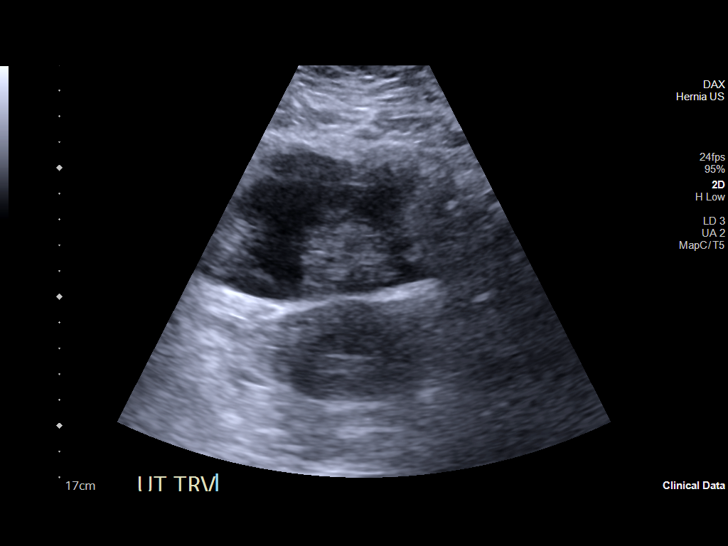
[im 7/55]
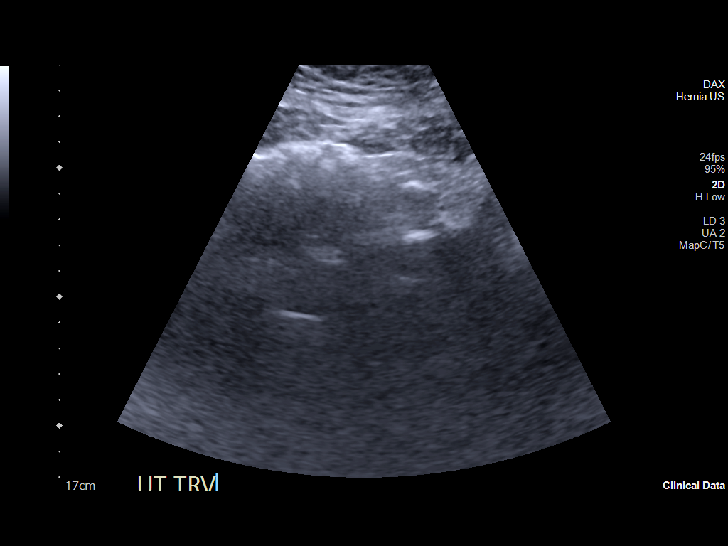
[im 11/55]
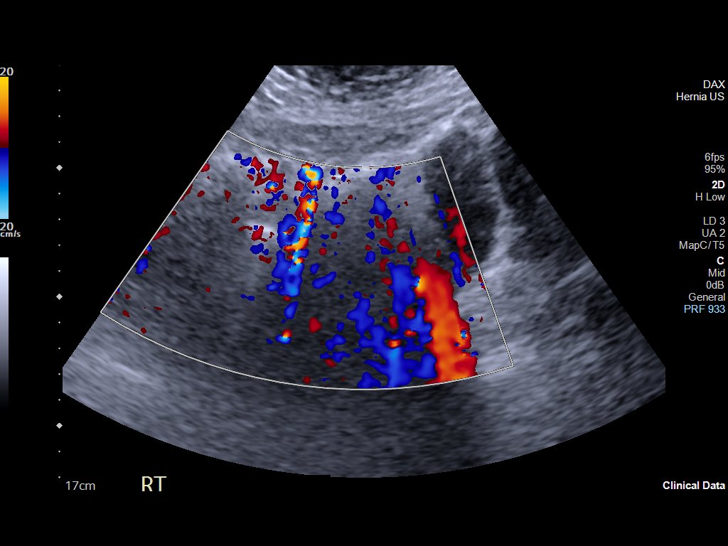
[im 15/55]
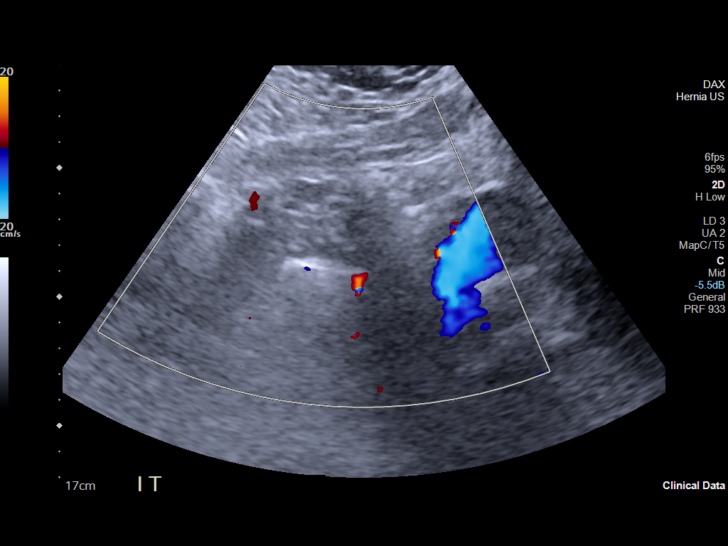
[im 19/55]
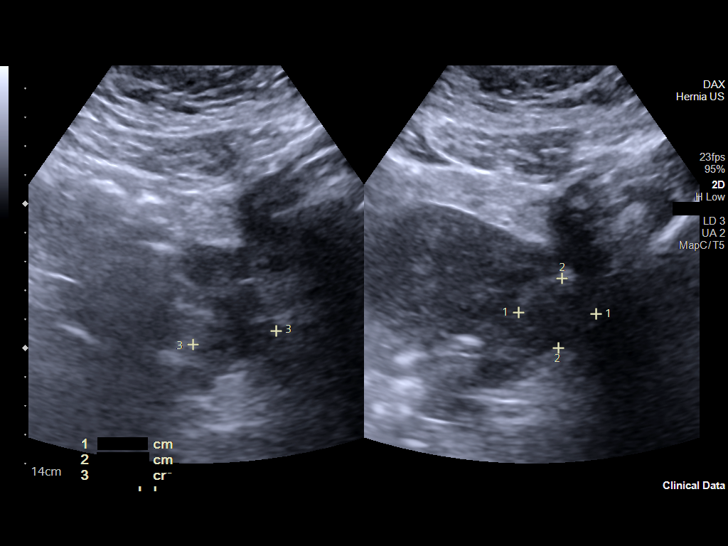
[im 23/55]
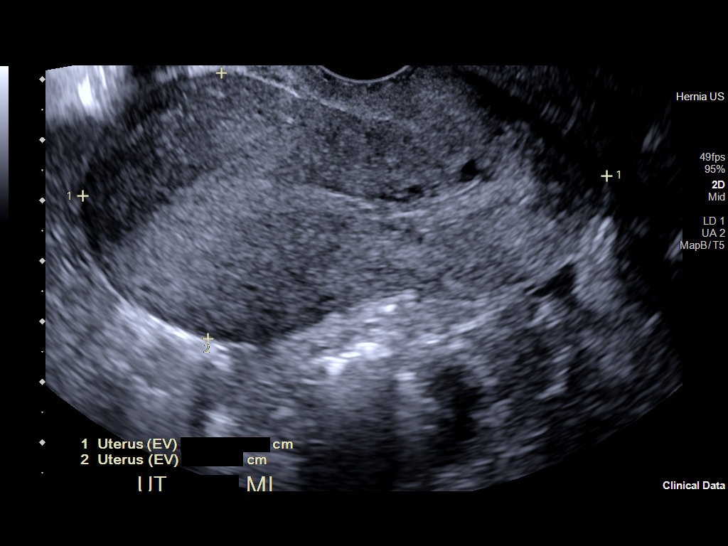
[im 29/55]
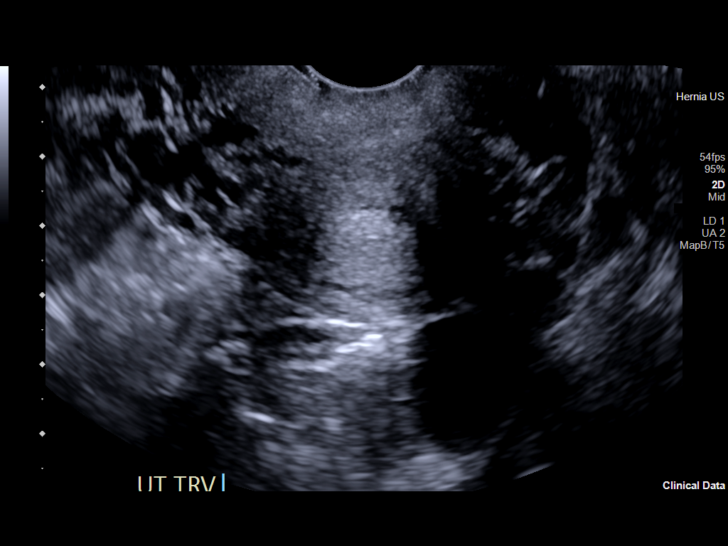
[im 33/55]
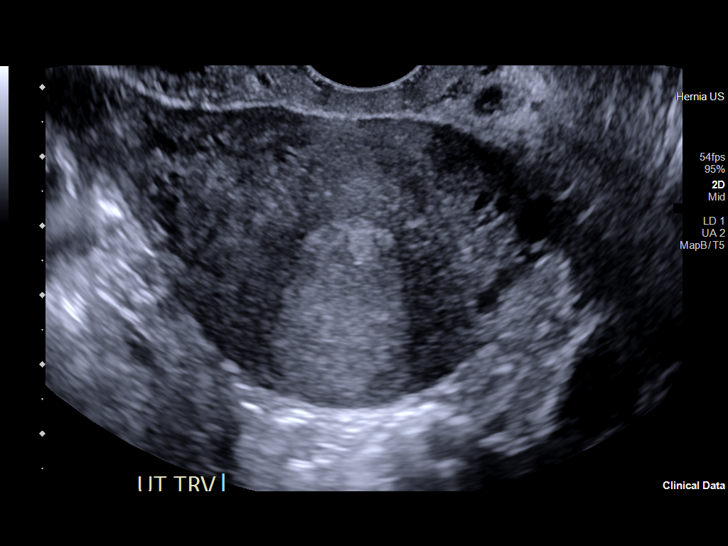
[im 37/55]
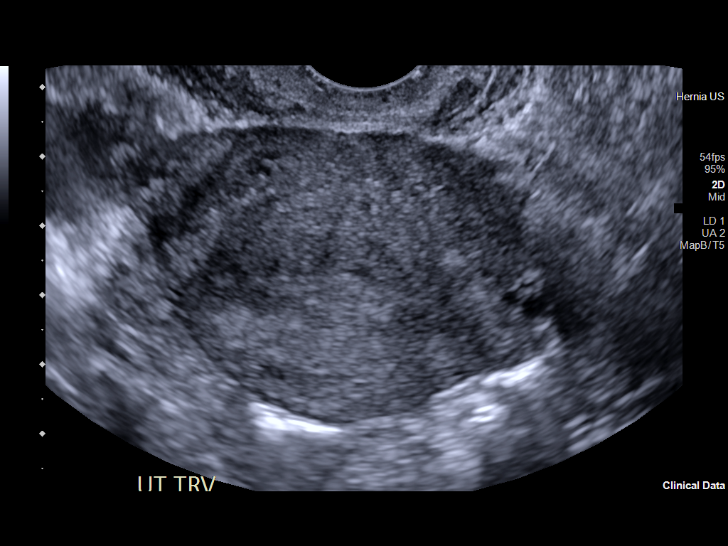
[im 41/55]
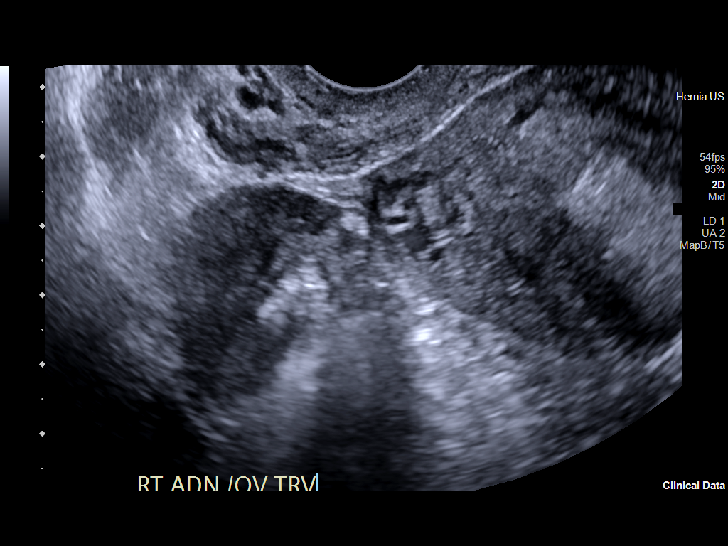
[im 45/55]
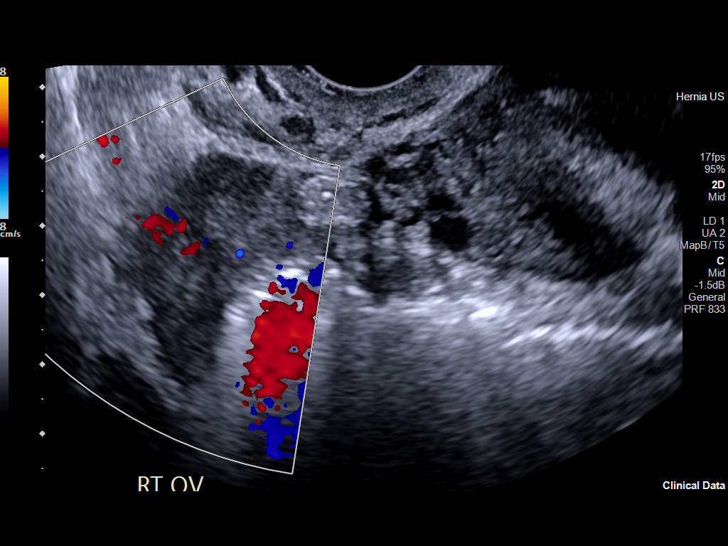
[im 49/55]
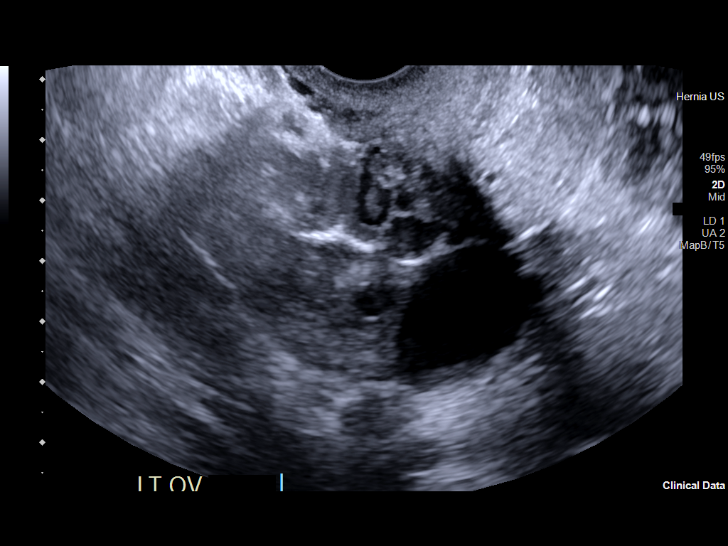
[im 53/55]
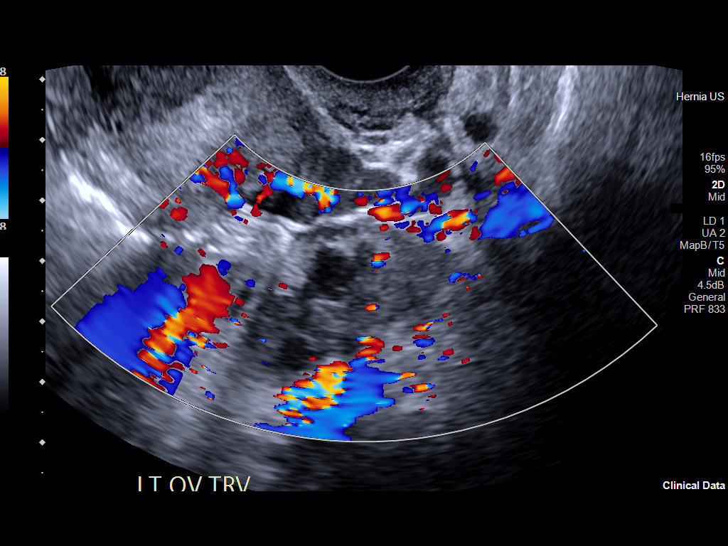

[13 of 28 positions shown; findings below may reference images not displayed]

FINDINGS: The uterus is anteverted appears slightly heterogeneous. No focal
mass.

The endometrium measures 4 mm in thickness and appears unremarkable.
No intrauterine pregnancy identified. The previously seen cystic
structure within the endometrium is no longer visualized.

The right ovary is unremarkable. There is a 3.3 x 1.8 x 1.7 cm left
ovarian or paraovarian cyst, also seen on the prior ultrasound.

Additionally there is a 1.3 x 1.2 cm rounded structure in the region
of the left ovary adjacent to the cyst which appears within the
ovary. This may represent a corpus luteum and likely corresponds to
the previously seen 1.7 x 1.7 x 2.0 cm rounded structure on the
ultrasound of 03/16/2021. The possibility of an ectopic pregnancy is
less likely but not entirely excluded clinical correlation is
recommended.

No free fluid in the pelvis.
IMPRESSION: 1. No intrauterine pregnancy identified. The previously seen cystic
structure within the endometrium is no longer visualized. Findings
may represent recent spontaneous miscarriage, however in the absence
of definite documentation of IUP, the possibility of an occult
ectopic pregnancy is not entirely excluded. Clinical correlation and
follow-up with serial HCG and repeat ultrasound in 7-11 days, or
earlier if clinically indicated, recommended.
2. Rounded structure in the region of the left ovary, likely corpus
luteum.

## 2023-08-27 ENCOUNTER — Encounter (HOSPITAL_COMMUNITY): Payer: Self-pay

## 2023-08-27 ENCOUNTER — Other Ambulatory Visit: Payer: Self-pay

## 2023-08-27 ENCOUNTER — Ambulatory Visit (HOSPITAL_COMMUNITY)
Admission: RE | Admit: 2023-08-27 | Discharge: 2023-08-27 | Disposition: A | Discharge status: Home / Self Care | Payer: Self-pay | Source: Ambulatory Visit | Attending: Family Medicine | Admitting: Family Medicine

## 2023-08-27 VITALS — BP 131/81 | HR 117 | Temp 99.6°F

## 2023-08-27 DIAGNOSIS — J00 Acute nasopharyngitis [common cold]: Secondary | ICD-10-CM | POA: Diagnosis present

## 2023-08-27 DIAGNOSIS — J029 Acute pharyngitis, unspecified: Secondary | ICD-10-CM | POA: Diagnosis present

## 2023-08-27 LAB — POC COVID19/FLU A&B COMBO
Covid Antigen, POC: NEGATIVE
Influenza A Antigen, POC: NEGATIVE
Influenza B Antigen, POC: NEGATIVE

## 2023-08-27 LAB — POCT RAPID STREP A (OFFICE): Rapid Strep A Screen: NEGATIVE

## 2023-08-27 MED ORDER — IBUPROFEN 800 MG PO TABS
ORAL_TABLET | ORAL | Status: AC
  Filled 2023-08-27: qty 1

## 2023-08-27 MED ORDER — IBUPROFEN 800 MG PO TABS
800.0000 mg | ORAL_TABLET | Freq: Once | ORAL | Status: AC
  Administered 2023-08-27: 800 mg via ORAL

## 2023-08-27 NOTE — ED Provider Notes (Signed)
 [facility]   [id number] 08/27/23 Arrival Time: 1107  ASSESSMENT & PLAN:  1. Common cold   2. Sore throat    Discussed typical duration of likely viral illness. Tolerating PO.  Results for orders placed or performed during the hospital encounter of 08/27/23  POC Covid19/Flu A&B Antigen   Collection Time: 08/27/23 12:47 PM  Result Value Ref Range   Influenza A Antigen, POC Negative Negative   Influenza B Antigen, POC Negative Negative   Covid Antigen, POC Negative Negative  POC rapid strep A   Collection Time: 08/27/23  1:14 PM  Result Value Ref Range   Rapid Strep A Screen Negative Negative   OTC symptom care as needed.  Meds ordered this encounter  Medications   ibuprofen  (ADVIL ) tablet 800 mg  Work note provided.  Reviewed expectations re: course of current medical issues. Questions answered. Outlined signs and symptoms indicating need for more acute intervention. Understanding verbalized. After Visit Summary given.   SUBJECTIVE: History from: Patient. Haley Cunningham is a 43 y.o. female. Pt states that yesterday afternoon she started feeling fatigued, body aches, chills, throat pain, and right neck pain. Pt states her mom had COVID but did not know and she was in the car with her. Pt states her pain is a 10/10. Pt states she has not tried anything at home.  Denies: fever. Normal PO intake without n/v/d.  OBJECTIVE:  Vitals:   08/27/23 1132  BP: 131/81  Pulse: (!) 117  Temp: 99.6 F (37.6 C)  TempSrc: Oral  SpO2: 98%    General appearance: alert; no distress Eyes: PERRLA; EOMI; conjunctiva normal HENT: [place]; AT; with nasal congestion; throat with mild erythema; tonsils without exudates Neck: supple without LAD Lungs: speaks full sentences without difficulty; unlabored; clear Extremities: no edema Skin: warm and dry Neurologic: normal gait Psychological: alert and cooperative; normal mood and affect  Labs: Results for orders placed or  performed during the hospital encounter of 08/27/23  POC Covid19/Flu A&B Antigen   Collection Time: 08/27/23 12:47 PM  Result Value Ref Range   Influenza A Antigen, POC Negative Negative   Influenza B Antigen, POC Negative Negative   Covid Antigen, POC Negative Negative  POC rapid strep A   Collection Time: 08/27/23  1:14 PM  Result Value Ref Range   Rapid Strep A Screen Negative Negative   Labs Reviewed  CULTURE, GROUP A STREP ([facility])  POC COVID19/FLU A&B COMBO  POCT RAPID STREP A (OFFICE)    Imaging: No results found.  No Known Allergies  Past Medical History:  Diagnosis Date   Diabetes mellitus without complication (HCC)    Social History   Socioeconomic History   Marital status: Single    Spouse name: Not on file   Number of children: Not on file   Years of education: Not on file   Highest education level: Not on file  Occupational History   Not on file  Tobacco Use   Smoking status: Never   Smokeless tobacco: Never  Vaping Use   Vaping status: Never Used  Substance and Sexual Activity   Alcohol use: Not Currently    Comment: occasionally   Drug use: No   Sexual activity: Not on file  Other Topics Concern   Not on file  Social History Narrative   Not on file   Social Drivers of Health   Financial Resource Strain: Medium Risk (05/22/2022)   Received from [facility], [facility]   Overall Financial Resource Strain (  CARDIA)    Difficulty of Paying Living Expenses: Somewhat hard  Food Insecurity: No Food Insecurity (05/22/2022)   Received from [facility], [facility]   Hunger Vital Sign    Worried About Running Out of Food in the Last Year: Never true    Ran Out of Food in the Last Year: Never true  Transportation Needs: No Transportation Needs (05/22/2022)   Received from [facility], [facility]   [facility] - Transportation    Lack of Transportation (Medical): No    Lack of Transportation (Non-Medical): No  Physical Activity:  Insufficiently Active (09/12/2021)   Received from [facility], [facility]   Exercise Vital Sign    Days of Exercise per Week: 3 days    Minutes of Exercise per Session: 30 min  Stress: No Stress Concern Present (09/12/2021)   Received from [facility], [facility] - Occupational Stress Questionnaire    Feeling of Stress : Only a little  Social Connections: Unknown (10/07/2022)   Received from [facility], [facility]   Social Network    Social Network: Not on file  Intimate Partner Violence: Unknown (10/07/2022)   Received from [facility], [facility]   HITS    Physically Hurt: Not on file    Insult or Talk Down To: Not on file    Threaten Physical Harm: Not on file    Scream or Curse: Not on file   Family History  Problem Relation Age of Onset   Healthy Mother    Healthy Father    Diabetes Maternal Grandmother    Diabetes Maternal Grandfather    History reviewed. No pertinent surgical history.   Afton Albright, MD 08/27/23 [phone]

## 2023-08-27 NOTE — ED Triage Notes (Signed)
 Pt states that yesterday afternoon she started feeling fatigued, body aches, chills, throat pain, and right neck pain. Pt states her mom had COVID but did not know and she was in the car with her. Pt states her pain is a 10/10. Pt states she has not tried anything at home.

## 2023-08-29 LAB — CULTURE, GROUP A STREP (THRC)

## 2023-08-30 ENCOUNTER — Telehealth (HOSPITAL_COMMUNITY): Payer: Self-pay

## 2023-08-30 MED ORDER — AMOXICILLIN 500 MG PO CAPS: 500.0000 mg | ORAL_CAPSULE | Freq: Two times a day (BID) | ORAL | 0 refills | Status: AC

## 2023-08-30 NOTE — Telephone Encounter (Signed)
 Per protocol, pt requires treatment with Amoxicillin.  Reviewed with patient, verified pharmacy, prescription sent.

## 2023-09-19 ENCOUNTER — Other Ambulatory Visit: Payer: Self-pay

## 2023-09-19 ENCOUNTER — Emergency Department (HOSPITAL_BASED_OUTPATIENT_CLINIC_OR_DEPARTMENT_OTHER): Admitting: Radiology

## 2023-09-19 ENCOUNTER — Emergency Department (HOSPITAL_BASED_OUTPATIENT_CLINIC_OR_DEPARTMENT_OTHER)
Admission: EM | Admit: 2023-09-19 | Discharge: 2023-09-19 | Disposition: A | Discharge status: Home / Self Care | Attending: Emergency Medicine | Admitting: Emergency Medicine

## 2023-09-19 DIAGNOSIS — R0789 Other chest pain: Secondary | ICD-10-CM | POA: Insufficient documentation

## 2023-09-19 DIAGNOSIS — R079 Chest pain, unspecified: Secondary | ICD-10-CM | POA: Diagnosis present

## 2023-09-19 LAB — CBC
HCT: 36.1 % (ref 36.0–46.0)
Hemoglobin: 11.6 g/dL — ABNORMAL LOW (ref 12.0–15.0)
MCH: 26.5 pg (ref 26.0–34.0)
MCHC: 32.1 g/dL (ref 30.0–36.0)
MCV: 82.4 fL (ref 80.0–100.0)
Platelets: 424 10*3/uL — ABNORMAL HIGH (ref 150–400)
RBC: 4.38 MIL/uL (ref 3.87–5.11)
RDW: 15.3 % (ref 11.5–15.5)
WBC: 9.4 10*3/uL (ref 4.0–10.5)
nRBC: 0 % (ref 0.0–0.2)

## 2023-09-19 LAB — BASIC METABOLIC PANEL WITH GFR
Anion gap: 14 (ref 5–15)
BUN: 7 mg/dL (ref 6–20)
CO2: 22 mmol/L (ref 22–32)
Calcium: 9.6 mg/dL (ref 8.9–10.3)
Chloride: 102 mmol/L (ref 98–111)
Creatinine, Ser: 0.7 mg/dL (ref 0.44–1.00)
GFR, Estimated: >60 mL/min (ref >60)
Glucose, Bld: 91 mg/dL (ref 70–99)
Potassium: 3.6 mmol/L (ref 3.5–5.1)
Sodium: 138 mmol/L (ref 135–145)

## 2023-09-19 LAB — D-DIMER, QUANTITATIVE: D-Dimer, Quant: 0.45 ug{FEU}/mL (ref 0.00–0.50)

## 2023-09-19 LAB — TROPONIN T, HIGH SENSITIVITY
Troponin T High Sensitivity: <15 ng/L (ref <19)
Troponin T High Sensitivity: <15 ng/L (ref <19)

## 2023-09-19 LAB — PREGNANCY, URINE: Preg Test, Ur: NEGATIVE

## 2023-09-19 MED ORDER — NAPROXEN 375 MG PO TABS: 375.0000 mg | ORAL_TABLET | Freq: Two times a day (BID) | ORAL | 0 refills | Status: AC

## 2023-09-19 NOTE — ED Triage Notes (Signed)
 Pt POV reporting intermittent sharp chest pain in the middle of her chest that began today. Denies n/v, afebrile, no cardiac hx. Respirations even and unlabored, NAD noted at this time.

## 2023-09-19 NOTE — Discharge Instructions (Signed)
You have been diagnosed by your caregiver as having chest wall pain. °SEEK IMMEDIATE MEDICAL ATTENTION IF: °You develop a fever.  °Your chest pains become severe or intolerable.  °You develop new, unexplained symptoms (problems).  °You develop shortness of breath, nausea, vomiting, sweating or feel light headed.  °You develop a new cough or you cough up blood. ° °

## 2023-09-19 NOTE — ED Provider Notes (Signed)
 [facility] AT [facility] Provider Note   CSN: [id number] Arrival date & time: 09/19/23  1824     History  Chief Complaint  Patient presents with   Chest Pain    Haley Cunningham is a 43 y.o. female who presents emergency department chief complaint of sharp central chest pain.  She had sudden onset of this chest pain which is worse with deep breathing better with rest she had associated shortness of breath but denies nausea, vomiting, diaphoresis or.  It is worse when she touches her chest.  Heart rate was elevated earlier today she did not feel anxious she said she was in pain her pain is improved.  Pain does not radiate.  She denies hemoptysis fevers use of exogenous estrogens recent confinement although she was sick and had strep throat recently and was in bed for a few days.  She denies unilateral leg swelling.   Chest Pain      Home Medications Prior to Admission medications   Medication Sig Start Date End Date Taking? Authorizing Provider  escitalopram  (LEXAPRO ) 5 MG tablet Take 1 tablet (5 mg total) by mouth daily. 03/22/23   Zelma Hidden, FNP  misoprostol  (CYTOTEC ) 200 MCG tablet Place 4 tablets (800 mcg total) vaginally once for 1 dose. 03/12/23 03/12/23  Lindle Rhea, MD  Prenatal Vit-Fe Fumarate-FA (PRENATAL COMPLETE) 14-0.4 MG TABS Take 1 tablet by mouth daily. Patient not taking: Reported on 03/22/2023 02/23/23   Henderly, Britni A, PA-C  ferrous fumarate (HEMOCYTE - 106 MG FE) 325 (106 FE) MG TABS tablet Take 1 tablet by mouth.  11/05/19  [provider]  pantoprazole  (PROTONIX ) 20 MG tablet Take 1 tablet (20 mg total) by mouth 2 (two) times daily for 30 days. 09/05/18 11/05/19  LampteyDonley Furth, MD      Allergies    Patient has no known allergies.    Review of Systems   Review of Systems  Cardiovascular:  Positive for chest pain.    Physical Exam Updated Vital Signs BP 120/89   Pulse 98   Temp 97.9 F (36.6  C)   Resp 16   Ht 5\' 7"  (1.702 m)   Wt 90.7 kg   LMP 08/18/2023 (Exact Date)   SpO2 99%   BMI 31.32 kg/m  Physical Exam Vitals and nursing note reviewed.  Constitutional:      General: She is not in acute distress.    Appearance: She is well-developed. She is not diaphoretic.  HENT:     Head: Normocephalic and atraumatic.     Right Ear: External ear normal.     Left Ear: External ear normal.     Nose: Nose normal.     Mouth/Throat:     Mouth: Mucous membranes are moist.  Eyes:     General: No scleral icterus.    Conjunctiva/sclera: Conjunctivae normal.  Cardiovascular:     Rate and Rhythm: Normal rate and regular rhythm.     Heart sounds: Normal heart sounds. No murmur heard.    No friction rub. No gallop.  Pulmonary:     Effort: Pulmonary effort is normal. No respiratory distress.     Breath sounds: Normal breath sounds.  Chest:     Chest wall: Tenderness present.    Abdominal:     General: Bowel sounds are normal. There is no distension.     Palpations: Abdomen is soft. There is no mass.     Tenderness: There is no abdominal tenderness.  There is no guarding.  Musculoskeletal:     Cervical back: Normal range of motion.  Skin:    General: Skin is warm and dry.  Neurological:     Mental Status: She is alert and oriented to person, place, and time.  Psychiatric:        Behavior: Behavior normal.     ED Results / Procedures / Treatments   Labs (all labs ordered are listed, but only abnormal results are displayed) Labs Reviewed  CBC - Abnormal; Notable for the following components:      Result Value   Hemoglobin 11.6 (*)    Platelets 424 (*)    All other components within normal limits  BASIC METABOLIC PANEL WITH GFR  PREGNANCY, URINE  D-DIMER, QUANTITATIVE  TROPONIN T, HIGH SENSITIVITY  TROPONIN T, HIGH SENSITIVITY    EKG EKG Interpretation Date/Time:  Thursday Sep 19 2023 18:35:48 EDT Ventricular Rate:  111 PR Interval:  146 QRS Duration:  86 QT  Interval:  334 QTC Calculation: 454 R Axis:   85  Text Interpretation: Sinus tachycardia Otherwise normal ECG When compared with ECG of 03-Jul-2022 21:57, increased rate from prior 3/24 Confirmed by Racheal Buddle [phone]) on 09/19/2023 6:38:20 PM  Radiology DG Chest 2 View Result Date: 09/19/2023 CLINICAL DATA:  Chest pain EXAM: CHEST - 2 VIEW COMPARISON:  07/03/2022 FINDINGS: The heart size and mediastinal contours are within normal limits. Both lungs are clear. Mild scoliosis. IMPRESSION: No active cardiopulmonary disease. Electronically Signed   By: Esmeralda Hedge M.D.   On: 09/19/2023 19:38    Procedures Procedures    Medications Ordered in ED Medications - No data to display  ED Course/ Medical Decision Making/ A&P Clinical Course as of 09/19/23 2124  Thu Sep 19, 2023  2123 Troponin T, High Sensitivity [AH]  2123 D-dimer, quantitative [AH]  2123 Pregnancy, urine [AH]  2123 Basic metabolic panel [AH]  2123 KG shows sinus tachycardia at a rate of 111 [AH]  2123 ED EKG [AH]  2123 DG Chest 2 View [AH]  2123 Visualized and interpreted two-view chest x-ray which shows no acute findings [AH]    Clinical Course User Index [AH] Tama Fails, PA-C                                 Medical Decision Making Given the large differential diagnosis for Haley Cunningham, the decision making in this case is of high complexity.  After evaluating all of the data points in this case, the presentation of Haley Cunningham is NOT consistent with Acute Coronary Syndrome (ACS) and/or myocardial ischemia, pulmonary embolism, aortic dissection; Borhaave's, significant arrythmia, pneumothorax, cardiac tamponade, or other emergent cardiopulmonary condition.  Further, the presentation of Haley Cunningham is NOT consistent with pericarditis, myocarditis, cholecystitis, pancreatitis, mediastinitis, endocarditis, new valvular disease.  Additionally, the presentation of Haley R Sparrowis NOT  consistent with flail chest, cardiac contusion, ARDS, or significant intra-thoracic or intra-abdominal bleeding.  Moreover, this presentation is NOT consistent with pneumonia, sepsis, or pyelonephritis.  The patient has a HEART Score: 0    Strict return and follow-up precautions have been given by me personally or by detailed written instruction given verbally by nursing staff using the teach back method to the patient/family/caregiver(s).  Data Reviewed/Counseling: I have reviewed the patient's vital signs, nursing notes, and other relevant tests/information. I had a detailed discussion regarding the historical points, exam findings, and any diagnostic results supporting  the discharge diagnosis. I also discussed the need for outpatient follow-up and the need to return to the ED if symptoms worsen or if there are any questions or concerns that arise at home.    Amount and/or Complexity of Data Reviewed Labs: ordered. Decision-making details documented in ED Course. Radiology: ordered. Decision-making details documented in ED Course. ECG/medicine tests:  Decision-making details documented in ED Course.             Final Clinical Impression(s) / ED Diagnoses Final diagnoses:  None    Rx / DC Orders ED Discharge Orders     None         Tama Fails, PA-C 09/19/23 2132    Tonya Fredrickson, MD 09/20/23 1006

## 2024-03-17 ENCOUNTER — Ambulatory Visit (HOSPITAL_COMMUNITY): Payer: Self-pay

## 2024-03-17 ENCOUNTER — Ambulatory Visit
Admission: RE | Admit: 2024-03-17 | Discharge: 2024-03-17 | Disposition: A | Discharge status: Home / Self Care | Payer: Self-pay | Source: Ambulatory Visit

## 2024-03-17 ENCOUNTER — Ambulatory Visit
Admission: RE | Admit: 2024-03-17 | Discharge: 2024-03-17 | Disposition: A | Discharge status: Home / Self Care | Source: Ambulatory Visit | Attending: Internal Medicine | Admitting: Internal Medicine

## 2024-03-17 ENCOUNTER — Encounter: Payer: Self-pay | Admitting: Emergency Medicine

## 2024-03-17 DIAGNOSIS — R42 Dizziness and giddiness: Secondary | ICD-10-CM

## 2024-03-17 LAB — GLUCOSE, POCT (MANUAL RESULT ENTRY): POC Glucose: 83 mg/dL (ref 70–99)

## 2024-03-17 NOTE — Discharge Instructions (Addendum)
 Dizziness symptoms but unsure etiology.  Vital signs and physical exam findings are reassuring.  There is no neurological deficits seen on brief neurological exam.  Blood sugar was 83.  Given the history of anemia and diabetes we will send off blood work today including complete blood count and complete metabolic panel.  This will check for any signs of worsening anemia or possible kidney or liver disease contributing to the symptoms.  At this time recommend going home and staying hydrated.  If you feel that the symptoms are worsening significantly then recommend going to the emergency room for further evaluation

## 2024-03-17 NOTE — ED Triage Notes (Addendum)
 Pt st's yesterday she had dizziness and light headiness   St's today is better  Pt also c/o headache

## 2024-03-17 NOTE — ED Provider Notes (Signed)
 [name]    CSN: [id number] Arrival date & time: 03/17/24  1841      History   Chief Complaint Chief Complaint  Patient presents with   Dizziness    HPI Haley Cunningham is a 43 y.o. female.   43 y.o. female who presents to urgent care with complaints of dizziness.  This started yesterday.  She reports that the dizziness is actually better today but still persistent.  She reports she has never had dizziness like this before.  She first noticed that yesterday morning when she sat up from bed.  She got very dizzy so she sat on the side of the bed for a few minutes then tried to stand.  Standing up made the dizziness worse as well.  She went and laid back down.  She got up again and was still dizzy but not as much.  She reports that the dizziness is not necessarily positional but does seem to get a little bit worse with standing quickly.  She denies any fevers, shortness of breath, chest pain, visual changes, facial paralysis, cough, congestion.  She does have a little bit of a headache today but is not severe.  She does have a history of diabetes but has not had any new medications or changes in medications.  Her blood sugars have been within normal limits at home.   Dizziness Associated symptoms: no chest pain, no palpitations, no shortness of breath and no vomiting     Past Medical History:  Diagnosis Date   Diabetes mellitus without complication (HCC)     There are no active problems to display for this patient.   History reviewed. No pertinent surgical history.  OB History     Gravida  2   Para      Term      Preterm      AB  2   Living  1      SAB  2   IAB      Ectopic      Multiple      Live Births  1            Home Medications    Prior to Admission medications   Medication Sig Start Date End Date Taking? Authorizing Provider  ondansetron  (ZOFRAN ) 4 MG tablet Take 4 mg by mouth every 8 (eight) hours as needed for nausea or  vomiting.   Yes [provider]  escitalopram  (LEXAPRO ) 5 MG tablet Take 1 tablet (5 mg total) by mouth daily. Patient not taking: Reported on 03/17/2024 03/22/23   Delores Nidia CROME, FNP  misoprostol  (CYTOTEC ) 200 MCG tablet Place 4 tablets (800 mcg total) vaginally once for 1 dose. Patient not taking: Reported on 03/17/2024 03/12/23 03/12/23  Trine Raynell Moder, MD  naproxen  (NAPROSYN ) 375 MG tablet Take 1 tablet (375 mg total) by mouth 2 (two) times daily with a meal. 09/19/23   Arloa Chroman, PA-C  Prenatal Vit-Fe Fumarate-FA (PRENATAL COMPLETE) 14-0.4 MG TABS Take 1 tablet by mouth daily. Patient not taking: Reported on 03/22/2023 02/23/23   Henderly, Britni A, PA-C  ferrous fumarate (HEMOCYTE - 106 MG FE) 325 (106 FE) MG TABS tablet Take 1 tablet by mouth.  11/05/19  [provider]  pantoprazole  (PROTONIX ) 20 MG tablet Take 1 tablet (20 mg total) by mouth 2 (two) times daily for 30 days. 09/05/18 11/05/19  Blaise Aleene KIDD, MD    Family History Family History  Problem Relation Age of Onset  Healthy Mother    Healthy Father    Diabetes Maternal Grandmother    Diabetes Maternal Grandfather     Social History Social History   Tobacco Use   Smoking status: Never   Smokeless tobacco: Never  Vaping Use   Vaping status: Never Used  Substance Use Topics   Alcohol use: Not Currently    Comment: occasionally   Drug use: No     Allergies   Patient has no known allergies.   Review of Systems Review of Systems  Constitutional:  Negative for chills and fever.  HENT:  Negative for ear pain and sore throat.   Eyes:  Negative for pain and visual disturbance.  Respiratory:  Negative for cough and shortness of breath.   Cardiovascular:  Negative for chest pain and palpitations.  Gastrointestinal:  Negative for abdominal pain and vomiting.  Genitourinary:  Negative for dysuria and hematuria.  Musculoskeletal:  Negative for arthralgias and back pain.  Skin:   Negative for color change and rash.  Neurological:  Positive for dizziness. Negative for seizures and syncope.  All other systems reviewed and are negative.    Physical Exam Triage Vital Signs ED Triage Vitals  Encounter Vitals Group     BP 03/17/24 1900 111/77     Girls Systolic BP Percentile --      Girls Diastolic BP Percentile --      Boys Systolic BP Percentile --      Boys Diastolic BP Percentile --      Pulse Rate 03/17/24 1900 82     Resp 03/17/24 1900 16     Temp 03/17/24 1900 98.3 F (36.8 C)     Temp Source 03/17/24 1900 Oral     SpO2 03/17/24 1900 98 %     Weight 03/17/24 1901 180 lb (81.6 kg)     Height 03/17/24 1901 5' 7 (1.702 m)     Head Circumference --      Peak Flow --      Pain Score 03/17/24 1900 6     Pain Loc --      Pain Education --      Exclude from Growth Chart --    No data found.  Updated Vital Signs BP 111/77 (BP Location: Left Arm)   Pulse 82   Temp 98.3 F (36.8 C) (Oral)   Resp 16   Ht 5' 7 (1.702 m)   Wt 180 lb (81.6 kg)   LMP 03/12/2024 (Exact Date)   SpO2 98%   BMI 28.19 kg/m   Visual Acuity Right Eye Distance:   Left Eye Distance:   Bilateral Distance:    Right Eye Near:   Left Eye Near:    Bilateral Near:     Physical Exam Vitals and nursing note reviewed.  Constitutional:      General: She is not in acute distress.    Appearance: She is well-developed.  HENT:     Head: Normocephalic and atraumatic.  Eyes:     Conjunctiva/sclera: Conjunctivae normal.  Cardiovascular:     Rate and Rhythm: Normal rate and regular rhythm.     Heart sounds: No murmur heard. Pulmonary:     Effort: Pulmonary effort is normal. No respiratory distress.     Breath sounds: Normal breath sounds.  Abdominal:     Palpations: Abdomen is soft.     Tenderness: There is no abdominal tenderness.  Musculoskeletal:        General: No swelling.     Cervical  back: Neck supple.  Skin:    General: Skin is warm and dry.     Capillary Refill:  Capillary refill takes less than 2 seconds.  Neurological:     General: No focal deficit present.     Mental Status: She is alert and oriented to person, place, and time.     Cranial Nerves: No cranial nerve deficit.     Gait: Gait normal.  Psychiatric:        Mood and Affect: Mood normal.        Behavior: Behavior normal.        Thought Content: Thought content normal.        Judgment: Judgment normal.      UC Treatments / Results  Labs (all labs ordered are listed, but only abnormal results are displayed) Labs Reviewed  COMPREHENSIVE METABOLIC PANEL WITH GFR  CBC  GLUCOSE, POCT (MANUAL RESULT ENTRY)    EKG   Radiology No results found.  Procedures Procedures (including critical care time)  Medications Ordered in UC Medications - No data to display  Initial Impression / Assessment and Plan / UC Course  I have reviewed the triage vital signs and the nursing notes.  Pertinent labs & imaging results that were available during my care of the patient were reviewed by me and considered in my medical decision making (see chart for details).     Dizziness - Plan: Comprehensive metabolic panel, CBC, Comprehensive metabolic panel, CBC, POCT CBG (manual entry), POCT CBG (manual entry)  Dizziness symptoms but unsure etiology.  Vital signs and physical exam findings are reassuring.  There is no neurological deficits seen on brief neurological exam.  Blood sugar was 83.  Given the history of anemia and diabetes we will send off blood work today including complete blood count and complete metabolic panel.  This will check for any signs of worsening anemia or possible kidney or liver disease contributing to the symptoms.  At this time recommend going home and staying hydrated.  If you feel that the symptoms are worsening significantly then recommend going to the emergency room for further evaluation  Final Clinical Impressions(s) / UC Diagnoses   Final diagnoses:  Dizziness      Discharge Instructions      Dizziness symptoms but unsure etiology.  Vital signs and physical exam findings are reassuring.  There is no neurological deficits seen on brief neurological exam.  Blood sugar was 83.  Given the history of anemia and diabetes we will send off blood work today including complete blood count and complete metabolic panel.  This will check for any signs of worsening anemia or possible kidney or liver disease contributing to the symptoms.  At this time recommend going home and staying hydrated.  If you feel that the symptoms are worsening significantly then recommend going to the emergency room for further evaluation    ED Prescriptions   None    PDMP not reviewed this encounter.   Teresa Almarie LABOR, [place] 03/17/24 1940

## 2024-03-18 ENCOUNTER — Ambulatory Visit (HOSPITAL_COMMUNITY): Payer: Self-pay

## 2024-03-18 LAB — CBC
Hematocrit: 39.4 % (ref 34.0–46.6)
Hemoglobin: 12.5 g/dL (ref 11.1–15.9)
MCH: 27.1 pg (ref 26.6–33.0)
MCHC: 31.7 g/dL (ref 31.5–35.7)
MCV: 85 fL (ref 79–97)
Platelets: 474 x10E3/uL — ABNORMAL HIGH (ref 150–450)
RBC: 4.62 x10E6/uL (ref 3.77–5.28)
RDW: 14 % (ref 11.7–15.4)
WBC: 8.4 x10E3/uL (ref 3.4–10.8)

## 2024-03-18 LAB — COMPREHENSIVE METABOLIC PANEL WITH GFR
ALT: 7 IU/L (ref 0–32)
AST: 9 IU/L (ref 0–40)
Albumin: 4.1 g/dL (ref 3.9–4.9)
Alkaline Phosphatase: 92 IU/L (ref 41–116)
BUN/Creatinine Ratio: 13 (ref 9–23)
BUN: 8 mg/dL (ref 6–24)
Bilirubin Total: 0.5 mg/dL (ref 0.0–1.2)
CO2: 23 mmol/L (ref 20–29)
Calcium: 9.5 mg/dL (ref 8.7–10.2)
Chloride: 101 mmol/L (ref 96–106)
Creatinine, Ser: 0.64 mg/dL (ref 0.57–1.00)
Globulin, Total: 3.8 g/dL (ref 1.5–4.5)
Glucose: 82 mg/dL (ref 70–99)
Potassium: 4.3 mmol/L (ref 3.5–5.2)
Sodium: 138 mmol/L (ref 134–144)
Total Protein: 7.9 g/dL (ref 6.0–8.5)
eGFR: 112 mL/min/1.73 (ref >59)

## 2024-04-08 ENCOUNTER — Ambulatory Visit (INDEPENDENT_AMBULATORY_CARE_PROVIDER_SITE_OTHER): Admitting: Radiology

## 2024-04-08 ENCOUNTER — Ambulatory Visit
Admission: RE | Admit: 2024-04-08 | Discharge: 2024-04-08 | Disposition: A | Discharge status: Home / Self Care | Attending: Internal Medicine

## 2024-04-08 VITALS — BP 123/78 | HR 99 | Temp 98.6°F | Resp 18 | Ht 68.0 in | Wt 185.0 lb

## 2024-04-08 DIAGNOSIS — R051 Acute cough: Secondary | ICD-10-CM

## 2024-04-08 DIAGNOSIS — J069 Acute upper respiratory infection, unspecified: Secondary | ICD-10-CM

## 2024-04-08 MED ORDER — AMOXICILLIN-POT CLAVULANATE 875-125 MG PO TABS: 1.0000 | ORAL_TABLET | Freq: Two times a day (BID) | ORAL | 0 refills | Status: AC

## 2024-04-08 MED ORDER — PROMETHAZINE-DM 6.25-15 MG/5ML PO SYRP: 5.0000 mL | ORAL_SOLUTION | Freq: Three times a day (TID) | ORAL | 0 refills | Status: AC | PRN

## 2024-04-08 MED ORDER — PREDNISONE 20 MG PO TABS: 40.0000 mg | ORAL_TABLET | Freq: Every day | ORAL | 0 refills | Status: AC

## 2024-04-08 NOTE — Discharge Instructions (Addendum)
 Chest x-ray done today.  Final evaluation by the radiologist does not show any acute findings.  Symptoms and duration of symptoms is most consistent with an acute respiratory infection.  Due to the duration of symptoms we will treat with the following: Augmentin  875 mg twice daily for 7 days.  This is an antibiotic.  Take this with food.  Promethazine  DM 5 mL every 8 hours as needed for cough.  Use caution as this medication can cause drowsiness. Prednisone  40 mg (2 tablets) once daily for 5 days. Take this in the morning.  This is a steroid to help with inflammation and pain.  Do not take ibuprofen  or naproxen  while you are taking this medication.  It is okay to take Tylenol . Make sure to stay hydrated by drinking plenty of water. Return to urgent care or PCP if symptoms worsen or fail to resolve.

## 2024-04-08 NOTE — ED Provider Notes (Signed)
 [facility]    CSN: [id number] Arrival date & time: 04/08/24  1738      History   Chief Complaint Chief Complaint  Patient presents with   Cough    Need to be texted for covid and flu. I can't smell and I have Cunningham dry cough that I can't get rid of. - Entered by patient    HPI Haley Cunningham is Cunningham 43 y.o. female.   43 year old female who presents urgent care with complaints of persistent cough for 1-1/2 weeks.  She reports that 1-1/2 weeks ago she was having cough, congestion, chills, diarrhea and loss of smell.  Her symptoms have resolved with the exception of the cough and loss of smell.  She did not test herself during this time for flu or COVID.  She has been taking over-the-counter medication without relief.  She is not having shortness of breath, fever, chills, chest pain, nausea, vomiting.  She does relate that the cough feels productive at times.   Cough Associated symptoms: no chest pain, no chills, no ear pain, no fever, no rash, no shortness of breath and no sore throat     Past Medical History:  Diagnosis Date   Diabetes mellitus without complication (HCC)     There are no active problems to display for this patient.   History reviewed. No pertinent surgical history.  OB History     Gravida  2   Para      Term      Preterm      AB  2   Living  1      SAB  2   IAB      Ectopic      Multiple      Live Births  1            Home Medications    Prior to Admission medications   Medication Sig Start Date End Date Taking? Authorizing Provider  amoxicillin -clavulanate (AUGMENTIN ) 875-125 MG tablet Take 1 tablet by mouth every 12 (twelve) hours. 04/08/24  Yes Miciah Shealy Cunningham, Haley Cunningham  predniSONE  (DELTASONE ) 20 MG tablet Take 2 tablets (40 mg total) by mouth daily with breakfast for 5 days. 04/08/24 04/13/24 Yes Kollins Fenter Cunningham, Haley Cunningham  promethazine -dextromethorphan (PROMETHAZINE -DM) 6.25-15 MG/5ML syrup Take 5 mLs by mouth  every 8 (eight) hours as needed for cough. 04/08/24  Yes Jaggar Benko Cunningham, Haley Cunningham  escitalopram  (LEXAPRO ) 5 MG tablet Take 1 tablet (5 mg total) by mouth daily. Patient not taking: Reported on 03/17/2024 03/22/23   Delores Nidia CROME, FNP  misoprostol  (CYTOTEC ) 200 MCG tablet Place 4 tablets (800 mcg total) vaginally once for 1 dose. Patient not taking: Reported on 03/17/2024 03/12/23 03/12/23  Trine Raynell Moder, MD  naproxen  (NAPROSYN ) 375 MG tablet Take 1 tablet (375 mg total) by mouth 2 (two) times daily with Cunningham meal. 09/19/23   Harris, Abigail, Haley Cunningham  ondansetron  (ZOFRAN ) 4 MG tablet Take 4 mg by mouth every 8 (eight) hours as needed for nausea or vomiting.    [provider]  Prenatal Vit-Fe Fumarate-FA (PRENATAL COMPLETE) 14-0.4 MG TABS Take 1 tablet by mouth daily. Patient not taking: Reported on 03/22/2023 02/23/23   Henderly, Britni Cunningham, Haley Cunningham  ferrous fumarate (HEMOCYTE - 106 MG FE) 325 (106 FE) MG TABS tablet Take 1 tablet by mouth.  11/05/19  [provider]  pantoprazole  (PROTONIX ) 20 MG tablet Take 1 tablet (20 mg total) by mouth 2 (two) times daily for 30 days. 09/05/18  11/05/19  Lamptey, Aleene KIDD, MD    Family History Family History  Problem Relation Age of Onset   Healthy Mother    Healthy Father    Diabetes Maternal Grandmother    Diabetes Maternal Grandfather     Social History Social History   Tobacco Use   Smoking status: Never   Smokeless tobacco: Never  Vaping Use   Vaping status: Never Used  Substance Use Topics   Alcohol use: Not Currently    Comment: occasionally   Drug use: No     Allergies   Patient has no known allergies.   Review of Systems Review of Systems  Constitutional:  Negative for chills and fever.  HENT:  Negative for ear pain and sore throat.        Loss of smell  Eyes:  Negative for pain and visual disturbance.  Respiratory:  Positive for cough. Negative for shortness of breath.   Cardiovascular:  Negative for chest  pain and palpitations.  Gastrointestinal:  Negative for abdominal pain and vomiting.  Genitourinary:  Negative for dysuria and hematuria.  Musculoskeletal:  Negative for arthralgias and back pain.  Skin:  Negative for color change and rash.  Neurological:  Negative for seizures and syncope.  All other systems reviewed and are negative.    Physical Exam Triage Vital Signs ED Triage Vitals  Encounter Vitals Group     BP 04/08/24 1754 123/78     Girls Systolic BP Percentile --      Girls Diastolic BP Percentile --      Boys Systolic BP Percentile --      Boys Diastolic BP Percentile --      Pulse Rate 04/08/24 1754 99     Resp 04/08/24 1754 18     Temp 04/08/24 1754 98.6 F (37 C)     Temp Source 04/08/24 1754 Oral     SpO2 04/08/24 1754 98 %     Weight 04/08/24 1754 185 lb (83.9 kg)     Height 04/08/24 1754 5' 8 (1.727 m)     Head Circumference --      Peak Flow --      Pain Score 04/08/24 1800 0     Pain Loc --      Pain Education --      Exclude from Growth Chart --    No data found.  Updated Vital Signs BP 123/78 (BP Location: Right Arm)   Pulse 99   Temp 98.6 F (37 C) (Oral)   Resp 18   Ht 5' 8 (1.727 m)   Wt 185 lb (83.9 kg)   LMP 03/12/2024 (Exact Date)   SpO2 98%   BMI 28.13 kg/m   Visual Acuity Right Eye Distance:   Left Eye Distance:   Bilateral Distance:    Right Eye Near:   Left Eye Near:    Bilateral Near:     Physical Exam Vitals and nursing note reviewed.  Constitutional:      General: She is not in acute distress.    Appearance: She is well-developed.  HENT:     Head: Normocephalic and atraumatic.  Eyes:     Conjunctiva/sclera: Conjunctivae normal.  Cardiovascular:     Rate and Rhythm: Normal rate and regular rhythm.     Heart sounds: No murmur heard. Pulmonary:     Effort: Pulmonary effort is normal. No tachypnea or respiratory distress.     Breath sounds: Examination of the right-upper field reveals rhonchi. Rhonchi present. No  decreased breath sounds or wheezing.  Abdominal:     Palpations: Abdomen is soft.     Tenderness: There is no abdominal tenderness.  Musculoskeletal:        General: No swelling.     Cervical back: Neck supple.  Skin:    General: Skin is warm and dry.     Capillary Refill: Capillary refill takes less than 2 seconds.  Neurological:     Mental Status: She is alert.  Psychiatric:        Mood and Affect: Mood normal.      UC Treatments / Results  Labs (all labs ordered are listed, but only abnormal results are displayed) Labs Reviewed - No data to display  EKG   Radiology DG Chest 2 View Result Date: 04/08/2024 EXAM: 2 VIEW(S) XRAY OF THE CHEST 04/08/2024 06:21:20 PM COMPARISON: 09/19/2023 CLINICAL HISTORY: Persistent cough for 1-1/2 weeks, possible rhonchi right upper lobe. FINDINGS: LUNGS AND PLEURA: No focal pulmonary opacity. No pleural effusion. No pneumothorax. HEART AND MEDIASTINUM: No acute abnormality of the cardiac and mediastinal silhouettes. BONES AND SOFT TISSUES: No acute osseous abnormality. IMPRESSION: 1. No acute cardiopulmonary abnormality. Electronically signed by: Lynwood Seip MD 04/08/2024 06:27 PM EST RP Workstation: [id number]    Procedures Procedures (including critical care time)  Medications Ordered in UC Medications - No data to display  Initial Impression / Assessment and Plan / UC Course  I have reviewed the triage vital signs and the nursing notes.  Pertinent labs & imaging results that were available during my care of the patient were reviewed by me and considered in my medical decision making (see chart for details).     Acute upper respiratory infection  Acute cough - Plan: DG Chest 2 View, DG Chest 2 View   Chest x-ray done today.  Final evaluation by the radiologist does not show any acute findings.  Symptoms and duration of symptoms is most consistent with an acute respiratory infection.  Due to the duration of symptoms we will treat  with the following: Augmentin  875 mg twice daily for 7 days.  This is an antibiotic.  Take this with food.  Promethazine  DM 5 mL every 8 hours as needed for cough.  Use caution as this medication can cause drowsiness. Prednisone  40 mg (2 tablets) once daily for 5 days. Take this in the morning.  This is Cunningham steroid to help with inflammation and pain.  Do not take ibuprofen  or naproxen  while you are taking this medication.  It is okay to take Tylenol . Make sure to stay hydrated by drinking plenty of water. Return to urgent care or PCP if symptoms worsen or fail to resolve.    Final Clinical Impressions(s) / UC Diagnoses   Final diagnoses:  Acute cough  Acute upper respiratory infection     Discharge Instructions      Chest x-ray done today.  Final evaluation by the radiologist does not show any acute findings.  Symptoms and duration of symptoms is most consistent with an acute respiratory infection.  Due to the duration of symptoms we will treat with the following: Augmentin  875 mg twice daily for 7 days.  This is an antibiotic.  Take this with food.  Promethazine  DM 5 mL every 8 hours as needed for cough.  Use caution as this medication can cause drowsiness. Prednisone  40 mg (2 tablets) once daily for 5 days. Take this in the morning.  This is Cunningham steroid to help with inflammation and pain.  Do  not take ibuprofen  or naproxen  while you are taking this medication.  It is okay to take Tylenol . Make sure to stay hydrated by drinking plenty of water. Return to urgent care or PCP if symptoms worsen or fail to resolve.       ED Prescriptions     Medication Sig Dispense Auth. Provider   amoxicillin -clavulanate (AUGMENTIN ) 875-125 MG tablet Take 1 tablet by mouth every 12 (twelve) hours. 14 tablet Haley Parady Cunningham, Haley Cunningham   promethazine -dextromethorphan (PROMETHAZINE -DM) 6.25-15 MG/5ML syrup Take 5 mLs by mouth every 8 (eight) hours as needed for cough. 180 mL Haley Fleek Cunningham, Haley Cunningham    predniSONE  (DELTASONE ) 20 MG tablet Take 2 tablets (40 mg total) by mouth daily with breakfast for 5 days. 10 tablet Haley Cunningham, Haley Cunningham      PDMP not reviewed this encounter.   Haley Almarie LABOR, Haley Cunningham 04/08/24 [phone]

## 2024-04-08 NOTE — ED Triage Notes (Signed)
 Pt states she was sick 1.5 week ago with diarrhea, chills cough. Presents today due to lingering cough and loss of smell.

## 2024-05-01 ENCOUNTER — Ambulatory Visit

## 2024-05-01 ENCOUNTER — Ambulatory Visit
Admission: EM | Admit: 2024-05-01 | Discharge: 2024-05-01 | Disposition: A | Discharge status: Home / Self Care | Attending: Emergency Medicine | Admitting: Emergency Medicine

## 2024-05-01 ENCOUNTER — Other Ambulatory Visit (HOSPITAL_COMMUNITY): Payer: Self-pay

## 2024-05-01 DIAGNOSIS — N760 Acute vaginitis: Secondary | ICD-10-CM

## 2024-05-01 LAB — POCT URINE PREGNANCY: Preg Test, Ur: NEGATIVE

## 2024-05-01 MED ORDER — MOUNJARO 15 MG/0.5ML ~~LOC~~ SOAJ
15.0000 mg | SUBCUTANEOUS | 1 refills | Status: AC
  Filled 2024-05-01: qty 2, 28d supply, fill #0

## 2024-05-01 MED ORDER — FLUCONAZOLE 150 MG PO TABS: ORAL_TABLET | ORAL | 0 refills | Status: AC

## 2024-05-01 NOTE — ED Provider Notes (Signed)
 " [facility]    CSN: [id number] Arrival date & time: 05/01/24  1529      History   Chief Complaint Chief Complaint  Patient presents with   Vaginal Itching   Vaginal Discharge    HPI Haley Cunningham is a 44 y.o. female.   Patient presents to clinic over concern of vaginal itching, irritation and increased white discharge for the past 2 days.  Mid December patient was started on Augmentin  for bacterial infection and she completed this.  She does have a history of type 2 diabetes and takes Mounjaro.  Her fianc is present with her in clinic.  Patient denies concern for sexually transmitted infections.  She does not have malodorous discharge.  She has not had any vaginal sores or lesions.  She feels like externally she may be swollen from irritation and does have tingling with urination.  Has not had urinary frequency, urgency or hematuria.  Denies abdominal pain.   The history is provided by the patient and medical records.  Vaginal Itching  Vaginal Discharge Associated symptoms: vaginal itching     Past Medical History:  Diagnosis Date   Diabetes mellitus without complication (HCC)     There are no active problems to display for this patient.   History reviewed. No pertinent surgical history.  OB History     Gravida  2   Para      Term      Preterm      AB  2   Living  1      SAB  2   IAB      Ectopic      Multiple      Live Births  1            Home Medications    Prior to Admission medications  Medication Sig Start Date End Date Taking? Authorizing Provider  fluconazole (DIFLUCAN) 150 MG tablet Take 1 tablet today and another in 72 hours if vaginal itching persist. 05/01/24  Yes Dreama, Zaylan Kissoon  N, FNP  amoxicillin -clavulanate (AUGMENTIN ) 875-125 MG tablet Take 1 tablet by mouth every 12 (twelve) hours. 04/08/24   White, Elizabeth A, PA-C  escitalopram  (LEXAPRO ) 5 MG tablet Take 1 tablet (5 mg total) by mouth  daily. Patient not taking: Reported on 03/17/2024 03/22/23   Delores Nidia CROME, FNP  misoprostol  (CYTOTEC ) 200 MCG tablet Place 4 tablets (800 mcg total) vaginally once for 1 dose. Patient not taking: Reported on 03/17/2024 03/12/23 03/12/23  Trine Raynell Moder, MD  naproxen  (NAPROSYN ) 375 MG tablet Take 1 tablet (375 mg total) by mouth 2 (two) times daily with a meal. 09/19/23   Arloa Chroman, PA-C  ondansetron  (ZOFRAN ) 4 MG tablet Take 4 mg by mouth every 8 (eight) hours as needed for nausea or vomiting.    [provider]  Prenatal Vit-Fe Fumarate-FA (PRENATAL COMPLETE) 14-0.4 MG TABS Take 1 tablet by mouth daily. Patient not taking: Reported on 03/22/2023 02/23/23   Henderly, Britni A, PA-C  promethazine -dextromethorphan (PROMETHAZINE -DM) 6.25-15 MG/5ML syrup Take 5 mLs by mouth every 8 (eight) hours as needed for cough. 04/08/24   White, Elizabeth A, PA-C  tirzepatide (MOUNJARO) 15 MG/0.5ML Pen Inject 15 mg into the skin once a week. 05/01/24     ferrous fumarate (HEMOCYTE - 106 MG FE) 325 (106 FE) MG TABS tablet Take 1 tablet by mouth.  11/05/19  [provider]  pantoprazole  (PROTONIX ) 20 MG tablet Take 1 tablet (20 mg total) by mouth 2 (  two) times daily for 30 days. 09/05/18 11/05/19  LampteyAleene KIDD, MD    Family History Family History  Problem Relation Age of Onset   Healthy Mother    Healthy Father    Diabetes Maternal Grandmother    Diabetes Maternal Grandfather     Social History Social History[1]   Allergies   Patient has no known allergies.   Review of Systems Review of Systems  Per HPI  Physical Exam Triage Vital Signs ED Triage Vitals  Encounter Vitals Group     BP 05/01/24 1628 113/68     Girls Systolic BP Percentile --      Girls Diastolic BP Percentile --      Boys Systolic BP Percentile --      Boys Diastolic BP Percentile --      Pulse Rate 05/01/24 1626 95     Resp 05/01/24 1626 19     Temp 05/01/24 1628 98.1 F (36.7 C)      Temp Source 05/01/24 1626 Oral     SpO2 05/01/24 1626 98 %     Weight --      Height --      Head Circumference --      Peak Flow --      Pain Score 05/01/24 1626 0     Pain Loc --      Pain Education --      Exclude from Growth Chart --    No data found.  Updated Vital Signs BP 113/68   Pulse 95   Temp 98.1 F (36.7 C) (Oral)   Resp 19   LMP 04/08/2024 (Exact Date)   SpO2 98%   Visual Acuity Right Eye Distance:   Left Eye Distance:   Bilateral Distance:    Right Eye Near:   Left Eye Near:    Bilateral Near:     Physical Exam Vitals and nursing note reviewed.  Constitutional:      Appearance: Normal appearance.  HENT:     Head: Normocephalic and atraumatic.     Right Ear: External ear normal.     Left Ear: External ear normal.     Nose: Nose normal.     Mouth/Throat:     Mouth: Mucous membranes are moist.  Eyes:     Conjunctiva/sclera: Conjunctivae normal.  Cardiovascular:     Rate and Rhythm: Normal rate.  Skin:    General: Skin is warm and dry.  Neurological:     General: No focal deficit present.     Mental Status: She is alert.  Psychiatric:        Mood and Affect: Mood normal.      UC Treatments / Results  Labs (all labs ordered are listed, but only abnormal results are displayed) Labs Reviewed  POCT URINE PREGNANCY    EKG   Radiology No results found.  Procedures Procedures (including critical care time)  Medications Ordered in UC Medications - No data to display  Initial Impression / Assessment and Plan / UC Course  I have reviewed the triage vital signs and the nursing notes.  Pertinent labs & imaging results that were available during my care of the patient were reviewed by me and considered in my medical decision making (see chart for details).  Vitals and triage reviewed, patient is hemodynamically stable.  Vaginal itching and increased white vaginal discharge in the setting of type 2 diabetes and recent antibiotic use.   Will treat empirically with Diflucan for yeast vaginitis.  Cytology swab sent and staff will contact if treatment modification is needed.  Urine pregnancy negative.  Plan of care, follow-up care return precautions given, no questions at this time.    Final Clinical Impressions(s) / UC Diagnoses   Final diagnoses:  Acute vaginitis     Discharge Instructions      Take the Diflucan today and another in 72 hours if the vaginal itching persist.  You can use over-the-counter Monistat for topical relief until the Diflucan kicks in.  Cytology swab has been sent off and we will contact you if we need to modify your treatment plan.  Return to clinic for any new or urgent symptoms.     ED Prescriptions     Medication Sig Dispense Auth. Provider   fluconazole (DIFLUCAN) 150 MG tablet Take 1 tablet today and another in 72 hours if vaginal itching persist. 2 tablet Dreama, Ithiel Liebler  N, FNP      PDMP not reviewed this encounter.     [1]  Social History Tobacco Use   Smoking status: Never   Smokeless tobacco: Never  Vaping Use   Vaping status: Never Used  Substance Use Topics   Alcohol use: Not Currently    Comment: occasionally   Drug use: No     Dreama Renell SAILOR, FNP 05/01/24 1709  "

## 2024-05-01 NOTE — Discharge Instructions (Addendum)
 Take the Diflucan today and another in 72 hours if the vaginal itching persist.  You can use over-the-counter Monistat for topical relief until the Diflucan kicks in.  Cytology swab has been sent off and we will contact you if we need to modify your treatment plan.  Return to clinic for any new or urgent symptoms.

## 2024-05-01 NOTE — ED Triage Notes (Signed)
 Pt present with c/o vaginal irritation x 2 days. Pt states it has worsened. Pt states she started having vaginal discharge 3 days. Today the color of the discharge is white and has vaginal swelling.

## 2024-05-04 LAB — CERVICOVAGINAL ANCILLARY ONLY
Bacterial Vaginitis (gardnerella): POSITIVE — AB
Candida Glabrata: NEGATIVE
Candida Vaginitis: POSITIVE — AB
Chlamydia: NEGATIVE
Comment: NEGATIVE
Comment: NEGATIVE
Comment: NEGATIVE
Comment: NEGATIVE
Comment: NEGATIVE
Comment: NORMAL
Neisseria Gonorrhea: NEGATIVE
Trichomonas: NEGATIVE

## 2024-05-05 ENCOUNTER — Ambulatory Visit (HOSPITAL_COMMUNITY): Payer: Self-pay

## 2024-05-05 MED ORDER — METRONIDAZOLE 500 MG PO TABS: 500.0000 mg | ORAL_TABLET | Freq: Two times a day (BID) | ORAL | 0 refills | Status: AC

## 2024-05-06 ENCOUNTER — Ambulatory Visit
